# Patient Record
Sex: Female | Born: 1960 | Race: White | Hispanic: No | Smoking: Never smoker
Health system: Southern US, Community
[De-identification: ages and names within clinical notes are randomized; demographics above are authoritative.]

## PROBLEM LIST (undated history)

## (undated) DIAGNOSIS — M62838 Other muscle spasm: Secondary | ICD-10-CM

## (undated) DIAGNOSIS — C801 Malignant (primary) neoplasm, unspecified: Secondary | ICD-10-CM

## (undated) DIAGNOSIS — G4700 Insomnia, unspecified: Secondary | ICD-10-CM

## (undated) DIAGNOSIS — K219 Gastro-esophageal reflux disease without esophagitis: Secondary | ICD-10-CM

## (undated) DIAGNOSIS — I10 Essential (primary) hypertension: Secondary | ICD-10-CM

## (undated) DIAGNOSIS — R519 Headache, unspecified: Secondary | ICD-10-CM

## (undated) DIAGNOSIS — G4733 Obstructive sleep apnea (adult) (pediatric): Secondary | ICD-10-CM

## (undated) HISTORY — DX: Obstructive sleep apnea (adult) (pediatric): G47.33

## (undated) HISTORY — DX: Headache, unspecified: R51.9

## (undated) HISTORY — PX: ABDOMINAL HYSTERECTOMY: SHX81

## (undated) HISTORY — DX: Gastro-esophageal reflux disease without esophagitis: K21.9

## (undated) HISTORY — DX: Other muscle spasm: M62.838

## (undated) HISTORY — DX: Essential (primary) hypertension: I10

## (undated) HISTORY — DX: Insomnia, unspecified: G47.00

## (undated) HISTORY — PX: BREAST SURGERY: SHX581

## (undated) HISTORY — DX: Malignant (primary) neoplasm, unspecified: C80.1

---

## 1997-11-02 ENCOUNTER — Other Ambulatory Visit: Admission: RE | Admit: 1997-11-02 | Discharge: 1997-11-02 | Payer: Self-pay | Admitting: Obstetrics and Gynecology

## 1999-04-19 ENCOUNTER — Encounter: Payer: Self-pay | Admitting: Surgery

## 1999-04-19 ENCOUNTER — Encounter (INDEPENDENT_AMBULATORY_CARE_PROVIDER_SITE_OTHER): Payer: Self-pay | Admitting: Specialist

## 1999-04-19 ENCOUNTER — Ambulatory Visit (HOSPITAL_COMMUNITY): Admission: RE | Admit: 1999-04-19 | Discharge: 1999-04-19 | Payer: Self-pay | Admitting: Surgery

## 1999-05-10 ENCOUNTER — Encounter: Payer: Self-pay | Admitting: Surgery

## 1999-05-15 ENCOUNTER — Inpatient Hospital Stay (HOSPITAL_COMMUNITY): Admission: RE | Admit: 1999-05-15 | Discharge: 1999-05-16 | Payer: Self-pay | Admitting: Surgery

## 1999-06-04 ENCOUNTER — Ambulatory Visit (HOSPITAL_BASED_OUTPATIENT_CLINIC_OR_DEPARTMENT_OTHER): Admission: RE | Admit: 1999-06-04 | Discharge: 1999-06-05 | Payer: Self-pay | Admitting: Surgery

## 1999-06-04 ENCOUNTER — Encounter (INDEPENDENT_AMBULATORY_CARE_PROVIDER_SITE_OTHER): Payer: Self-pay | Admitting: *Deleted

## 1999-06-20 ENCOUNTER — Encounter: Payer: Self-pay | Admitting: Surgery

## 1999-06-20 ENCOUNTER — Ambulatory Visit (HOSPITAL_COMMUNITY): Admission: RE | Admit: 1999-06-20 | Discharge: 1999-06-20 | Payer: Self-pay | Admitting: Surgery

## 2000-04-08 ENCOUNTER — Other Ambulatory Visit: Admission: RE | Admit: 2000-04-08 | Discharge: 2000-04-08 | Payer: Self-pay | Admitting: Obstetrics and Gynecology

## 2000-05-20 ENCOUNTER — Other Ambulatory Visit: Admission: RE | Admit: 2000-05-20 | Discharge: 2000-05-20 | Payer: Self-pay | Admitting: Obstetrics and Gynecology

## 2000-05-20 ENCOUNTER — Encounter (INDEPENDENT_AMBULATORY_CARE_PROVIDER_SITE_OTHER): Payer: Self-pay | Admitting: Specialist

## 2000-09-23 ENCOUNTER — Encounter (INDEPENDENT_AMBULATORY_CARE_PROVIDER_SITE_OTHER): Payer: Self-pay | Admitting: Specialist

## 2000-09-24 ENCOUNTER — Inpatient Hospital Stay (HOSPITAL_COMMUNITY): Admission: RE | Admit: 2000-09-24 | Discharge: 2000-09-25 | Payer: Self-pay | Admitting: Obstetrics and Gynecology

## 2000-10-31 ENCOUNTER — Ambulatory Visit (HOSPITAL_BASED_OUTPATIENT_CLINIC_OR_DEPARTMENT_OTHER): Admission: RE | Admit: 2000-10-31 | Discharge: 2000-10-31 | Payer: Self-pay | Admitting: Plastic Surgery

## 2000-11-10 ENCOUNTER — Ambulatory Visit (HOSPITAL_BASED_OUTPATIENT_CLINIC_OR_DEPARTMENT_OTHER): Admission: RE | Admit: 2000-11-10 | Discharge: 2000-11-10 | Payer: Self-pay | Admitting: Surgery

## 2009-07-27 ENCOUNTER — Ambulatory Visit (HOSPITAL_COMMUNITY): Admission: RE | Admit: 2009-07-27 | Discharge: 2009-07-27 | Payer: Self-pay | Admitting: Gastroenterology

## 2010-05-23 ENCOUNTER — Other Ambulatory Visit: Payer: Self-pay | Admitting: Internal Medicine

## 2010-05-23 DIAGNOSIS — Z9010 Acquired absence of unspecified breast and nipple: Secondary | ICD-10-CM

## 2010-05-23 DIAGNOSIS — Z1231 Encounter for screening mammogram for malignant neoplasm of breast: Secondary | ICD-10-CM

## 2010-05-31 ENCOUNTER — Other Ambulatory Visit (HOSPITAL_COMMUNITY): Payer: Self-pay

## 2010-06-06 ENCOUNTER — Other Ambulatory Visit (HOSPITAL_COMMUNITY): Payer: Self-pay

## 2010-06-07 ENCOUNTER — Ambulatory Visit (HOSPITAL_COMMUNITY): Payer: 59 | Attending: Internal Medicine

## 2010-06-07 ENCOUNTER — Encounter: Payer: Self-pay | Admitting: Cardiology

## 2010-06-07 DIAGNOSIS — Z853 Personal history of malignant neoplasm of breast: Secondary | ICD-10-CM | POA: Insufficient documentation

## 2010-06-07 DIAGNOSIS — I059 Rheumatic mitral valve disease, unspecified: Secondary | ICD-10-CM | POA: Insufficient documentation

## 2010-06-14 ENCOUNTER — Other Ambulatory Visit: Payer: Self-pay | Admitting: Internal Medicine

## 2010-06-14 ENCOUNTER — Ambulatory Visit
Admission: RE | Admit: 2010-06-14 | Discharge: 2010-06-14 | Disposition: A | Discharge status: Home / Self Care | Payer: 59 | Source: Ambulatory Visit | Attending: Internal Medicine | Admitting: Internal Medicine

## 2010-06-14 DIAGNOSIS — Z9010 Acquired absence of unspecified breast and nipple: Secondary | ICD-10-CM

## 2010-06-14 DIAGNOSIS — Z1231 Encounter for screening mammogram for malignant neoplasm of breast: Secondary | ICD-10-CM

## 2010-08-17 NOTE — Op Note (Signed)
[place]  Patient:    Kristin Hobbs, Kristin Hobbs                         MRN: [id number] Proc. Date: 06/20/99 Adm. Date:  [id number] Disc. Date: [id number] Attending:  Bonnetta Barry                           Operative Report  PREOPERATIVE DIAGNOSIS:  Breast carcinoma.  POSTOPERATIVE DIAGNOSIS:  Breast carcinoma.  OPERATION PERFORMED:  Placement of right subclavian vein infusion port.  SURGEON:  Velora Heckler, M.D.  ANESTHESIA:  Local with intravenous sedation.  PREPARATION:  Betadine.  COMPLICATIONS:  None.  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  The patient is now to begin a course of adjuvant chemotherapy t [facility] in [place].  She desires placement of chronic venous access device.  DESCRIPTION OF PROCEDURE:  The procedure was done in OR #16 at the [facility].  The patient was brought to the operating room and placed in  supine position on the operating room table.  Following administration of intravenous sedation, the patient was positioned and then prepped and draped in the usual strict aseptic fashion.  After ascertaining that an adequate level of sedation had been obtained, the skin beneath the right clavicle was anesthetized with local anesthetic.  The patient was placed in Trendelenburg.  Using an 18 gauge Seldinger needle, the right subclavian vein was entered in a single pass.  A soft J-shaped guide wire was inserted and the needle was removed.  Guide wire placement was confirmed with C-arm fluoroscopy.  Next, a site on the right chest wall was  anesthetized with local anesthetic.  A 3 cm incision was made with a #15 blade.  Dissection was carried down to the subcutaneous tissues and a subcutaneous pocket was created to accommodate the infusion  port.  Hemostasis was obtained with the  electrocautery.  The port was brought on the field.  The catheter was tunneled subcutaneously to the site of guide wire insertion.  The port was secured to the chest wall at two sites with interrupted 2-0 Prolene sutures.  The catheter was  measured to the appropriate length and divided.  Using a dilator and peel-away sleeve, the catheter was inserted into the right subclavian vein without difficulty.  C-arm fluoroscopy confirms catheter to be position.  However, there is a slight curl to the tip.  By manipulating the catheter in the subcutaneous space, I am able to straighten the catheter so that there is no curl to the tip and the catheter was oriented in the proper position with the tip at the junction of the superior vena cava and the right atrium.  C-arm fluoroscopy confirmed placement. The port aspirates blood easily and flushes easily with heparinized saline. The wounds were closed with interrupted 4-0 Vicryl subcutaneous sutures.  The skin as closed with interrupted 4-0 Vicryl subcuticular sutures.  The wound was washed nd dried and benzoin and Steri-Strips were placed.  The port was then accessed with a right angle Huber needle on a butterfly type tubing attachment.  It was again flushed with heparinized saline and then flushed with 3 cc of standard strength  1000 unit per cc heparin.  Sterile occlusive gauze dressing was placed.  The patient was awakened  from anesthesia and brought to the recovery room in stable  condition.  The patient tolerated the procedure well.  A portable chest x-ray will be performed in recovery room to confirm catheter placement. DD:  06/20/99 TD:  06/20/99 Job: [id number]

## 2010-08-17 NOTE — Op Note (Signed)
[facility]  Patient:    Kristin Hobbs, Kristin Hobbs                       MRN: [id number] Proc. Date: 09/23/00 Adm. Date:  [id number] Disc. Date: [id number] Attending:  Cordelia Pen Ii                           Operative Report  PREOPERATIVE DIAGNOSIS: 1. Left breast cancer. 2. Acquired absence of left breast. 3. Radiation fibrosis, left anterior chest skin.  POSTOPERATIVE DIAGNOSIS: 1. Left breast cancer. 2. Acquired absence of left breast. 3. Radiation fibrosis, left anterior chest skin.  OPERATION PERFORMED: 1. Debridement of radiated left anterior chest skin. 2. Latissimus dorsi myocutaneous flap. 3. Placement of 500 cc McGhan sublatissimus tissue expander inflated to    100 cc at the time of surgery.  SURGEON:  Alfredia Ferguson, M.D.  ASSISTANT:  Pleas Patricia, Montez Hageman., M.D.  ANESTHESIA:  General endotracheal.  INDICATIONS FOR PROCEDURE:  The patient is a 50 year old woman who is status post left mastectomy for breast cancer.  She has been treated with chemotherapy followed by radiation therapy.  An attempt at placement of a tissue expander on the left side was made but it was complicated by an infection which required removal of the tissue expander.  The patient is undergoing a hysterectomy and she wishes to attempt another tissue expander. She was told that it would be impossible to expand her without the use of a latissimus muscle.  She did not want to undergo a TRAM flap, since it would require bilateral TRAMS.  The plan is to try a latissimus flap with placement of the implant beneath the latissimus muscle.  The potential risks of surgery including infection, bleeding, fibrosis, inability to expand the patient, unsightly scarring, asymmetry, exposure of the tissue expander and overall dissatisfaction with the results have been discussed.  In spite of the these and other risks discussed, the patient wishes to proceed with  surgery.  DESCRIPTION OF PROCEDURE:  Prior to surgery, the patient was marked, outlining the dimensions of a skin paddle on her back measuring approximately 6.5 cm wide x 15 cm long.  Anteriorly, the plan is to debride as much of the fibrotic mastectomy skin as possible.  It was marked on the day prior to surgery. Following the completion of the hysterectomy, the patient was turned into a right lateral decubitus position and all pressure points were padded including placement of an axillary roll.  The patient was reprepped and draped. Attention was first directed to the old mastectomy site.  An excision of the mastectomy scar with a couple of centimeters on either side of the scar was carried out.  I would have like to remove more; however, after removing even the 3 to 3.5 cm of skin, the mastectomy defect widened such that I felt that a further debridement would be unwise.  The breast flaps were undermined to their original dimensions of the mastectomy.  The anterior border of the latissimus muscle was now visualized.  An elliptical incision was made through the previously placed marks on her back in order to isolate a skin paddle over the latissimus muscle.  The elliptical incisions were deepened until visualized in the latissimus muscle.  Through this approach the back flap was elevated off of the latissimus dorsi fascia to the midline and then up to the scapula and then inferiorly near its origin.  Good hemostasis was accomplished throughout this dissection using electrocautery.  The anterior edge of the latissimus muscle was now elevated and through this approach, the deep surface of the latissimus muscle was dissected off the chest wall. Inferiorly, I divided the latissimus muscle near its origin and the muscle was elevated from an inferior to superior direction.  The muscle was divided using electrocautery.  Hemostasis was meticulously accomplished using electrocautery.  The  myocutaneous flap was elevated up to the point where the latissimus began to narrow towards its insertion.  A tunnel was created in order to allow passing the myocutaneous flap anteriorly.  The muscle and its overlying skin paddle was now tunneled into the desired position.  It rotated with no tension whatsoever.  The color of the flap was excellent at the conclusion of the rotation.  The back wound was now copiously irrigated with saline irrigation.  Hemostasis was assured using electrocautery.  Two Blake drains were placed in the back wound and brought out through separate stab incisions.  The back incision was closed by approximating the dermis using a combination of interrupted 0 and 2-0 Vicryl sutures in the dermis.  The skin edges were united using a running 3-0 Monocryl suture.  The bean bag was now deflated and the patient was allowed to rotate slightly into a more supine position in order to allow working on her anterior chest.  The mastectomy dissection was irrigated with saline irrigation and a 10 mm Blake drain was placed in the mastectomy dissection and brought out through a separate stab incision.  The superior portion of the latissimus muscle was tacked to the upper limits of the mastectomy dissection.  3-0 Vicryl was used to tack the muscle in place.  Medially, a similar tacking was carried out.  A 500 cc McGhan tissue expander was prepared by evacuating the air and then inflating it with 100 cc of saline.  The tissue expander was placed into the desired position beneath the latissimus muscle.  Inferiorly, the latissimus was again tacked down using interrupted 3-0 Vicryl suture to the inferior limits of the mastectomy dissection.  The superior cut edge of the skin paddle was now united to the superior edge of the breast flap using multiple interrupted 3-0 Monocryl sutures to the dermis, followed by a running 3-0 Monocryl subcuticular.  Similar closure was carried out on the  inferior skin paddle to the inferior breast flap.  The inferior breast flap was very tight due to radiation fibrosis.  I was unable to remove any additional skin due to the  tightness of the skin and its inability in my opinion to be able to be closed had I removed more skin.  Following closure of the skin several interrupted 4-0 nylon sutures were placed in the skin edges to assure closure of the edges.  Steri-Strips were applied along the skin edges.  A light dressing was applied anteriorly.  The patient tolerated the procedure well with minimal blood loss estimated to be approximately 100 to 150 cc.  She was awakened, extubated and transported to the recovery room in satisfactory condition. DD:  09/23/00 TD:  09/23/00 Job: [id number]

## 2010-08-17 NOTE — Op Note (Signed)
. [facility]  Patient:    Kristin Hobbs, Kristin Hobbs                         MRN: [id number] Proc. Date: 05/15/99 Adm. Date:  [id number] Attending:  Bonnetta Barry CCAlfredia Ferguson, M.D.             Guy Sandifer Arleta Creek, M.D.             Pearson Forster, M.D.             [facility]                           Operative Report  PREOPERATIVE DIAGNOSIS: 1. 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 2. Incarcerated umbilical hernia.  POSTOPERATIVE DIAGNOSIS: 1. 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 2. Incarcerated umbilical hernia.  OPERATION PERFORMED: 1. Left total mastectomy. 2. Repair of incarcerated umbilical hernia with Prolene mesh.  SURGEON:  Velora Heckler, M.D.  ASSISTANT:  Rose Phi. Maple Hudson, M.D.  ANESTHESIA:  General.  ESTIMATED BLOOD LOSS:  Minimal.  PREPARATION:  Betadine.  COMPLICATIONS:  None.  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  Studies showed an area of architectural distortion and extensive microcalcification in the lateral portion of the left breast.  Studies were reviewed with our radiologist and a core needle biopsy was performed which documented DCIS with comedo type necrosis and high grade changes. Due to the extensive nature of the disease, the best course of treatment was mastectomy.  The patient was seen in consultation with Dr. Delia Chimes for immediate reconstruction with tissue expanders to be followed by implants.  The  patient also has an incarcerated hernia which she desires to be repaired concurrently.  She comes to surgery today for left mastectomy, reconstruction, nd umbilical hernia repair.  DESCRIPTION OF PROCEDURE:  The procedure was done in OR #15 at the [facility].  The patient  was brought to the operating room and placed in  supine position on the operating room table.  Following administration of general anesthesia, the patient was positioned and then prepped and draped in the usual  strict aseptic fashion.  After ascertaining that an adequate level of anesthesia had been obtained, an elliptical incision was made on the left breast so as to encompass the entire nipple areolar complex.  Dissection was carried into the subcutaneous tissues.  Skin flaps were developed circumferentially using breast  hooks and the electrocautery for hemostasis.  The usual margins of dissection were followed including the sternum medially, the clavicle superiorly, the latissimus dorsi laterally and the rectus sheath inferiorly.  The breast was then reflected laterally off of the chest wall taking care to preserve the pectoralis major muscle.  Good hemostasis was noted.  Two low lymph nodes from the low axilla were included with the specimen and marked with a suture for pathologic review. Good hemostasis was obtained throughout the wound and a moist gauze sponge was placed in the defect.  At this point Dr. Delia Chimes proceeded with immediate reconstruction using tissue expander.  That will be dictated under a separate operative report.  Next I addressed the incarcerated umbilical hernia.  A transverse  umbilical incision was made just below the umbilicus.  Dissection was carried into the subcutaneous tissues.  The hernia defect was defined.  It contained incarcerated omentum.  This was freed from the undersurface of the umbilicus and reduced back within the peritoneal cavity.  The defect measures approximately 1.5 cm in size. It was closed with interrupted 0 Ethibond sutures.  Next, a piece of Prolene mesh was cut to the appropriate dimensions and placed on the muscular wall of the abdomen.  It was secured circumferentially with 0 Ethibond sutures.  The  wound as irrigated with saline, good hemostasis was noted.  The umbilicus was fixed back  down to the abdominal wall with an interrupted 3-0 Vicryl suture.  Subcutaneous  tissues were reapproximated with interrupted 3-0 Vicryl sutures.  Skin was closed with interrupted 4-0 Vicryl subcuticular sutures.  The wound was washed and dried and benzoin and Steri-Strips were applied at the end of the case.  Dr. Benna Dunks will dictate his breast augmentation and tissue expander reconstruction under a separate operative report.  The patient tolerated this portion of the procedure very well. DD:  05/15/99 TD:  05/15/99 Job: [id number]

## 2010-08-17 NOTE — Op Note (Signed)
[facility]  Patient:    Kristin Hobbs, Kristin Hobbs                       MRN: [id number] Proc. Date: 09/23/00 Adm. Date:  [id number] Attending:  Cordelia Pen Ii                           Operative Report  PREOPERATIVE DIAGNOSIS:       Status post breast cancer, desires hysterectomy with bilateral salpingo-oophorectomy.  POSTOPERATIVE DIAGNOSIS:      Status post breast cancer, desires hysterectomy with bilateral salpingo-oophorectomy.  PROCEDURE:                    Laparoscopically assisted vaginal hysterectomy with bilateral salpingo-oophorectomy.  SURGEON:                      Guy Sandifer. Arleta Creek, M.D.  ASSISTANTFreddy Finner, M.D.  ANESTHESIA:                   General with endotracheal intubation.  ESTIMATED BLOOD LOSS:         299 cc.  INDICATIONS AND CONSENT:      The patient is a 50 year old married white female, G2, P2 status post left breast cancer.  Details are dictated in the history and physical.  Laparoscopically assisted vaginal hysterectomy with bilateral salpingo-oophorectomy was discussed with the patient.  THe possible risks and complications were discussed, including but not limited to infection; bowel, bladder or ureteral damage; bleeding requiring transfusion of blood products with possible transfusion reaction, HIV and hepatitis acquisition; DVT; PE; pneumonia; fistula formation.  The small chance of recurrent ovarian-type carcinoma following BSO was also discussed.  All questions were answered and consent was signed and on the chart.  FINDINGS:                     There was a dense adhesion of the omentum and a loop of the small bowel to the upper part of the midline infraumbilical scar to the anterior abdominal wall.  The uterus was normal in size.  The anterior and posterior cul-de-sac were normal.  The tubes and ovaries were normal.  DESCRIPTION OF PROCEDURE:     The patient was taken to the  operating room and placed in the dorsal supine position, where general anesthesia was induced via endotracheal intubation.  She was then placed in the dorsal lithotomy position, where she  was prepped abdominal and vaginal.  The bladder was straight catheterized.  A Hulka tenaculum was placed in the uterus as a manipulator and she was draped in a sterile fashion.  An open Hasson-type disposable trocar sleeve was used.  An infraumbilical incision was made using two Allis clamps to elevate the abdominal wall.  Dissection was carried out in layers to the peritoneum, which was bluntly entered.  Anchoring sutures of 0 Vicryl were placed at each angle of the fascia and the Hasson scope was placed and anchored down.  Pneumoperitoneum was induced and the above findings were noted.  No damage to the bowel or any other structure was noted after careful inspection.  The vesicoperitoneum was noted to extend somewhat high on the lower anterior abdominal wall.  A suprapubic incision was made with care being  taken to go above this level and a 5 mm nondisposable trocar sleeve was placed under direct visualization without difficulty.  After careful transillumination, a left lower quadrant incision was made and a second 5 mm trocar sleeve was then placed under direct visualization without difficulty. Then, using a 5 mm laparoscope through the left lower quadrant trocar sleeve, the Ligasure laparoscopic instrument was used to take down the right infundibulopelvic ligament, followed by the meso, followed by the round ligament.  This procedure was repeated on the left side.  The course of the ureters were then identified carefully prior to this and seen to be well clear of the area of surgery.  The 5 mm laparoscope was then removed and reinspection with the operative laparoscope revealed good hemostasis.  The vesicouterine peritoneum was then incised in the midline.  It was densely adherent to the lower uterine  segment in the midline status post her cesarean sections.  The vesicouterine peritoneum was taken down cephalolaterally.  The instruments were removed and attention was then turned to the vagina.  The posterior cul-de-sac was entered sharply with scissors.  The uterosacral ligaments were then taken down bilaterally, again using the Ligasure instrument.  The cervix was then circumscribed with a scalpel and the vaginal mucosa was advanced bluntly and sharply.  The bladder pillars followed by the cardinal ligaments were then taken down bilaterally.  Again, the bladder was densely adherent to the midline along the lower uterine segment and the upper cervix.  The uterine vessels were then taken bilaterally.  The uterine fundus with tubes and ovaries was then delivered posteriorly and an examining finger was placed on the anterior portion of the uterus and the vesicouterine peritoneum was then taken down sharply, cutting over the examining finger to protect the bladder.  The pedicles were then taken down bilaterally and the specimen was delivered.  The uterosacral ligaments were then plicated to the vagina bilaterally and plicated in the midline with a separate suture.  The cuff was then closed with figure-of-eights.  A Foley catheter was placed in the bladder and clear urine was noted.  Attention was returned to the adequate and careful inspection revealed bleeding from the right vaginal pedicle.  This was controlled with bipolar cautery without difficulty.  Copious irrigation was carried out and excellent hemostasis was noted all around.  The inferior trocar sleeves were removed.  The pneumoperitoneum was reduced and no bleeding was noted from any site.  The instruments were then removed along with the Hasson trocar sleeve.  The fascia was then carefully reapproximated with interrupted 0 Vicryl sutures, with great care being taken not to pick up any underlying structures.  Interrupted 4-0 Vicryl  sutures were then placed subcutaneously and the skin was closed in all the incisions with Dermabond. The incisions were then injected with 0.5% plain Marcaine.  All counts were  correct and the patient was stable.  At this point, Dr. Benna Dunks began his procedure. DD:  09/23/00 TD:  09/23/00 Job: [id number]

## 2010-08-17 NOTE — Op Note (Signed)
[place]  Patient:    Kristin Hobbs, Kristin Hobbs                       MRN: [id number] Proc. Date: 11/10/00 Adm. Date:  [id number] Disc. Date: [id number] Attending:  Loura Halt Ii                           Operative Report  PREOPERATIVE DIAGNOSIS:  History of left breast carcinoma.  POSTOPERATIVE DIAGNOSIS:  History of left breast carcinoma.  OPERATION PERFORMED:  Removal of right subclavian vein infusion port.  SURGEON:  Velora Heckler, M.D.  ANESTHESIA:  Local with intravenous sedation.  PREPARATION:  Betadine.  COMPLICATIONS:  None.  ESTIMATED BLOOD LOSS:  Minimal.  INDICATIONS FOR PROCEDURE:  The patient is a 50 year old white female being treated for left breast carcinoma.  She has completed adjuvant chemotherapy. She desires removal of her chronic venous access device.  DESCRIPTION OF PROCEDURE:  The procedure was done in OR #3 at the [facility].  The patient was brought to the operating room and placed in supine position on the operating room table.  Following administration of general sedation, the patient was prepped and draped in the usual strict aseptic fashion.  After ascertaining that an adequate level of sedation had been obtained, the skin on the upper right chest wall was anesthetized with local anesthetic. An elliptical incision was made with a #15 blade so as to excise the previous surgical scar.  Dissection was carried down through the subcutaneous tissues and hemostasis was obtained with the electrocautery.  The fibrous sheath was entered and the port was mobilized.  The port was delivered up and through the wound.  Suture material securing the port to the chest wall was extracted.  The port was removed completely and a 3-0 Vicryl pursestring suture was placed along the catheter tract to seal it.  Next, the fibrous sheath was obliterated with the electrocautery.  Subcutaneous tissues  were reapproximated with interrupted 3-0 Vicryl sutures.  Skin edges were reapproximated with interrupted 4-0 Vicryl subcuticular sutures.  The wound was washed and dried and benzoin and Steri-Strips were applied.  The Port-A-Cath was cleansed and then given to the patient.  The patient tolerated the procedure well. DD:  11/10/00 TD:  11/10/00 Job: [id number]

## 2010-08-17 NOTE — H&P (Signed)
[facility]  Patient:    Kristin Hobbs, Kristin Hobbs                         MRN: [id number] Adm. Date:  09/23/00 Attending:  Guy Sandifer. Arleta Creek, M.D.                         History and Physical  CHIEF COMPLAINT:              History of breast cancer on tamoxifen for LAVH/BSO.  HISTORY OF PRESENT ILLNESS:   This patient is a 50 year old married white female, G2, P2 who was diagnosed with a left breast cancer in February of 2001. She subsequently underwent a left total mastectomy that month. Subsequent to that, she has undergone chemotherapy and radiation. Finally, she had removal of the implant in the left breast in April of this year after apparent rupture and infection around the implant. This has left her with some subsequent lymphedema of the left arm. At the time of her annual exam, in January of this year, the patient was amenorrheic and on tamoxifen. An ultrasound was then undertaken to evaluate the endometrial stripe. This revealed a normal-sized uterus with an 8 mm endometrial stripe and a 16 mm simple cyst of the right ovary. Endometrial biopsy on May 19, 2000, revealed a weakly proliferative endometrium without hyperplasia or malignancy. The patient discussed this situation with her oncologist Dr. Rolly Salter at [facility]. Due to the patients increased risk of endometrial carcinoma, as well as her aggressive breast cancer, she requested a hysterectomy with BSO. After a careful discussion of the surgery and its risks, the patient is being admitted for laparoscopically-assisted vaginal hysterectomy with bilateral salpingo-oophorectomy. Alfredia Ferguson, M.D. will then follow for TRAM flap reconstruction of the left breast.  PAST MEDICAL HISTORY:         1. Left breast cancer as above.                               2. Ankylosing spondylitis.  PAST SURGICAL HISTORY:        1. Left total mastectomy, February 2001.                               2.  Repair of incarcerated umbilical hernia with                                  Prolene mesh, February of 2001.  OBSTETRIC HISTORY:            Cesarean section x 2.  FAMILY HISTORY:               Negative for heart disease, cancer, diabetes, and hypertension. Her brother does have ankylosing spondylitis and a sister has hyperthyroidism.  MEDICATIONS:                  1. Tamoxifen.                               2. Coumadin 1 mg daily for her Port-A-Cath.  3. Prevacid 30 mg.                               4. Celebrex 200 mg b.i.d.                               5. Paxil 20 mg daily.                               6. Nu-Iron daily.                               7. Darvocet p.r.n.  ALLERGIES:                    No known drug allergies.  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 Denies alcohol, tobacco, or drug abuse.  PHYSICAL EXAMINATION:  VITAL SIGNS:                  Height 5 feet 8 inches, weight 171 1/2 pounds, blood pressure 112/78.  HEENT/NECK:                   Without thyromegaly.  LUNGS:                        Clear to auscultation.  HEART:                        Regular rate and rhythm.  BACK:                         Without CVA tenderness.  BREASTS:                      Right breast without mass, traction, discharge on examination of January of this year. Left breast:  The chest is bandaged, status post removal of the implant.  ABDOMEN:                      Soft, nontender, without masses.  PELVIC:                       Vulva, vagina, cervix without lesion. Uterus normal size, nontender. Adnexa nontender without masses. Rectovaginal exam without masses.  EXTREMITIES:                  Reveals 1+ lymphedema of the left arm. Lower extremities without edema.  NEUROLOGICAL:                 Grossly within normal limits.  ASSESSMENT:                   Status post breast cancer desires  hysterectomy with bilateral salpingo-oophorectomy.  PLAN:                         Laparoscopically-assisted vaginal hysterectomy with bilateral salpingo-oophorectomy and TRAM flap reconstruction to follow with W. Delia Chimes, M.D.DD:  09/10/00 TD:  09/10/00 Job: [id number]

## 2010-08-17 NOTE — Op Note (Signed)
[place]. [facility]  Patient:    Kristin Hobbs, Kristin Hobbs                       MRN: [id number] Proc. Date: 10/31/00 Adm. Date:  [id number] Attending:  Loura Halt Ii                           Operative Report  PREOPERATIVE DIAGNOSIS:  Infected tissue expander, left breast reconstruction.  POSTOPERATIVE DIAGNOSIS:  Infected tissue expander, left breast reconstruction.  PROCEDURE:  Removal of left chest tissue expander.  SURGEON:  Alfredia Ferguson, M.D.  ANESTHESIA:  General laryngeal mask anesthesia.  INDICATION FOR SURGERY:  This is a 50 year old woman who is status post latissimus flap with placement of a tissue expander beneath the latissimus flap.  She also has a history of radiation therapy to her breast flaps after mastectomy.  The patient has done fine over the last five weeks, although she has had a chronic seroma.  I removed the drain about one week prior to the surgery.  Since removal of the drain, she has had a progressive increase in erythema in the medial aspect of the left chest in the vicinity of the tissue expander.  The skin has thinned.  I am concerned that there is an impending exposure.  Patient denies fevers.  In light of her radiation and the erythema, my feeling is that the tissue expander should be removed.  I aspirated some of the fluid yesterday, and it came back cloudy, yellow, with Gram stain showing numerous polymorphonuculeocytes.  The patient understands that we going to remove the tissue expander.  She understands that she will be flat other than the presence of the latissimus flap.  In spite of that, she wishes to proceed.  DESCRIPTION OF PROCEDURE:  After adequate general laryngeal mask anesthesia was induced, the patients left chest was prepped and draped in a sterile fashion.  Initially a two-inch incision was made along the previous skin paddle incision of the latissimus flap and was deepened through the skin  and subcutaneous tissue until reaching the latissimus flap.  The inferior breast flap was elevated off of the latissimus flap inferiorly and laterally.  The latissimus muscle was visualized and was opened.  There was an immediate rush of cloudy, mucousy fluid.  Approximately 300-400 cc of fluid was present. Cultures were obtained.  The tissue expander was punctured in order to remove the saline.  The tissue expander was then removed.  The pocket was copiously irrigated with saline irrigation.  There was a significant amount of granulation tissue throughout the pocket.  There was also accumulations of mucoid material, which was also evacuated.  A 10 mm Blake drain was placed in the pocket.  It was secured through a separate stab incision.  After achieving hemostasis, the latissimus muscle was tacked back down to the inferior breast flap.  Vicryl 3-0 suture was used for this tacking.  The dermis was closed using multiple interrupted 3-0 Vicryl suture.  A running 3-0 Vicryl subcuticular was placed.  The patient tolerated the procedure well.  The chest was cleansed.  Steri-Strips were applied to the skin incision.  A bulky dressing over the left chest was placed with a circumferential wrap with a six-inch Ace bandage.  The patient was awakened, extubated, and transported to the recovery room in satisfactory condition. DD:  10/31/00 TD:  11/01/00 Job: [id number]

## 2010-08-17 NOTE — Op Note (Signed)
[place]. [facility]  Patient:    Kristin Hobbs, Kristin Hobbs                         MRN: [id number] Proc. Date: 06/04/99 Adm. Date:  [id number] Attending:  Bonnetta Barry CCAlfredia Ferguson, M.D.             Dr. Lillia Mountain in Medical Oncology Division at [facility]                           Operative Report  PREOPERATIVE DIAGNOSIS:  Left breast carcinoma.  POSTOPERATIVE DIAGNOSIS:  Left breast carcinoma.  PROCEDURE:  Left axillary lymph node dissection.  SURGEON:  Velora Heckler, M.D.  ANESTHESIA:  General.  ESTIMATED BLOOD LOSS:  Minimal.  PREPARATION:  Betadine.  COMPLICATIONS:  None.  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 The patient now comes to surgery for a left axillary lymph node dissection for further staging of her left breast carcinoma.  DESCRIPTION OF PROCEDURE:  The procedure was done in OR #3 at the [facility].  The patient was brought to the operating room and placed in a supine position on the operating room table.  Following the administration of general anesthesia, the patient was prepped and draped in the usual strict aseptic fashion. After ascertaining that an adequate level of anesthesia had been obtained, a left axillary incision was made with a #15 blade.  Dissection was carried through the subcutaneous tissues.  Hemostasis was obtained with the electrocautery. Dissection was carried into the axilla.  The lateral edge of the pectoralis major muscle was identified.  A small seroma in the previous operative field was evacuated.  The  axillary contents were then dissected away from the chest wall.  Dissection was  carried up to the level of the axillary vein, and dissection was kept  just inferior to the axillary vein.  Long thoracic nerve and thoracodorsal nerve and vessels ere identified and preserved.  The axillary contents were swept down and out of the  axilla.  There are no obvious grossly positive lymph nodes in the specimen. The entire axillary content is submitted to pathology for review.  Hemostasis was obtained with small Ligaclips.  Good hemostasis was noted.  A  #10 Jackson-Pratt drain was brought in from an inferior stab wound, and then secured to the skin with a 4-0 nylon suture.  The wound was irrigated with warm  saline which was evacuated.  Subcutaneous tissues were reapproximated with interrupted 3-0 Vicryl sutures.  The skin edges were reapproximated with interrupted 4-0 Vicryl subcuticular sutures.  The wound was washed and dried, and Benzoin and Steri-Strips are applied.  Sterile gauze dressings were applied.  The patient was awakened from anesthesia and brought to the recovery room in stable condition.  The patient tolerated the procedure well. DD:  06/04/99 TD:  06/05/99 Job: [id number]

## 2010-08-17 NOTE — Discharge Summary (Signed)
[facility]  Patient:    Kristin Hobbs, Kristin Hobbs                       MRN: [id number] Adm. Date:  [id number] Disc. Date: 09/25/00 Attending:  Cordelia Pen Ii CC:         Alfredia Ferguson, M.D.   Discharge Summary  ADMITTING DIAGNOSES:          1. Status post breast cancer, desires                                  hysterectomy with bilateral                                  salpingo-oophorectomy.                               2. Status post mastectomy, requests                                  reconstructive surgery.  DISCHARGE DIAGNOSES:          1. Status post breast cancer, desires                                  hysterectomy with bilateral                                  salpingo-oophorectomy.                               2. Status post mastectomy, requests                                  reconstructive surgery.  PROCEDURES:                   1. Laparoscopically assisted vaginal                                  hysterectomy with bilateral                                  salpingo-oophorectomy, September 23, 2000.                               2. Left platysmus breast reconstruction, Dr.                                  Benna Dunks.  REASON FOR ADMISSION:         This patient is a 50 year old married white female, G2, P2, status post left breast cancer, status post left mastectomy, who requested removal of her ovaries and uterus as dictated in the history and physical.  She also required reconstruction  of the left breast.  After a thorough discussion with me and with Dr. Benna Dunks, she was admitted for the above procedures.  HOSPITAL COURSE:              The patient was admitted to the hospital and underwent the above procedures with a total blood loss of approximately 350 cc.  On the evening of surgery, she was afebrile with good urine output. On the first postoperative day, she was tolerating a regular diet an ambulating.  She remained afebrile.   Hemoglobin was 9.9 and white count was 4.1.  On the date of discharge, she continued to ambulate well, tolerating a regular diet and had good pain relief with oral pain medications.  CONDITION ON DISCHARGE:       Good.  DIET:                         Regular as tolerated.  ACTIVITY:                     No lifting.  No operation of automobiles.  No vaginal entry.  She is to call for problems including but not limited to temperature 101 or above, heavy vaginal bleeding, persistent nausea or vomiting or increasing pain.  Instructions have also been given per Dr. Benna Dunks.  MEDICATIONS:                  Tylox, #20, 1-2 p.o. q.6h. p.r.n.  Vicodin, #25, 1-2 p.o. q.6h. p.r.n.  Multivitamin daily.  The patient will resume Coumadin 1 mg q.d. tomorrow.  She will also resume Tamoxifen tomorrow.  FOLLOW UP:                    In two weeks in the office and per Dr. Audelia Hives instructions. DD:  09/25/00 TD:  09/25/00 Job: [id number]

## 2010-12-24 DIAGNOSIS — C50919 Malignant neoplasm of unspecified site of unspecified female breast: Secondary | ICD-10-CM | POA: Insufficient documentation

## 2011-05-02 IMAGING — CT CT ABD-PELV W/ CM
2 of 5 series · 16 of 46 positions shown, 18 images · IV contrast (APPLIED)
Comparison: None.

CLINICAL DATA: Abdominal pain, nausea, history of breast carcinoma,
polyps recently removed during colonoscopy

CT ABDOMEN AND PELVIS WITH CONTRAST
TECHNIQUE: Multidetector CT imaging of the abdomen and pelvis was
performed following the standard protocol during bolus
administration of intravenous contrast.
Contrast: 100 ml Fmnipaque-NOO

[Series 2: abd/pelv with 5.0 b31f st · axial · 0.65mm/px · z∈[-438,-63]mm · 13 of 85 slices shown, 15 images]
[im 5/85  soft-tissue]
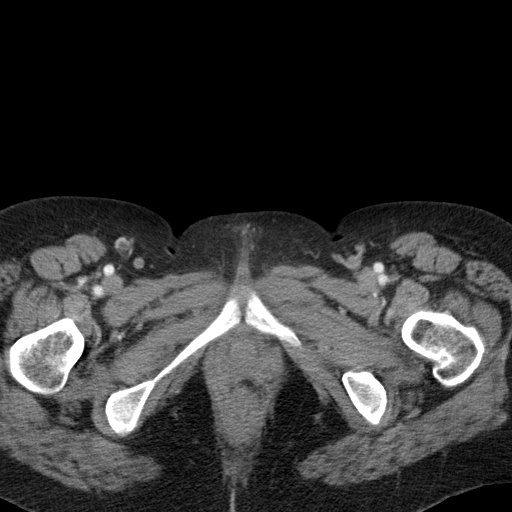
[im 5/85  bone]
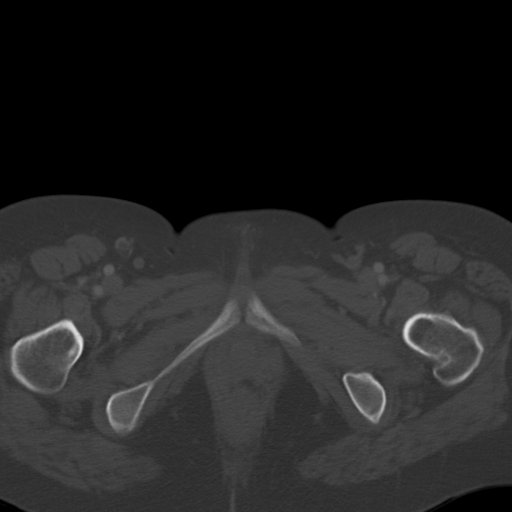
[im 13/85  soft-tissue]
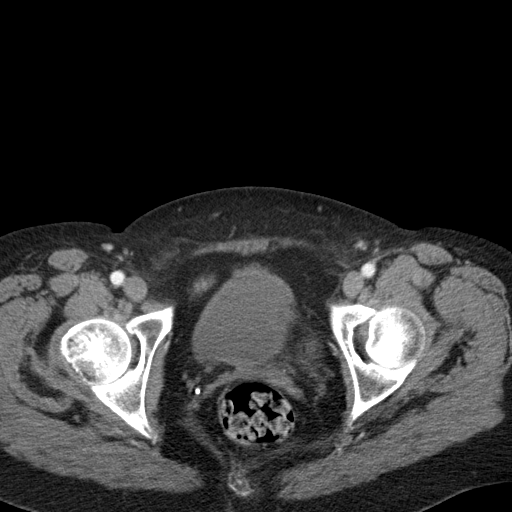
[im 17/85  soft-tissue]
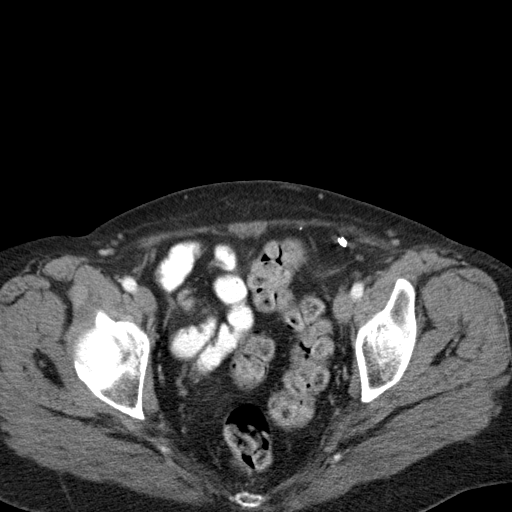
[im 26/85  soft-tissue]
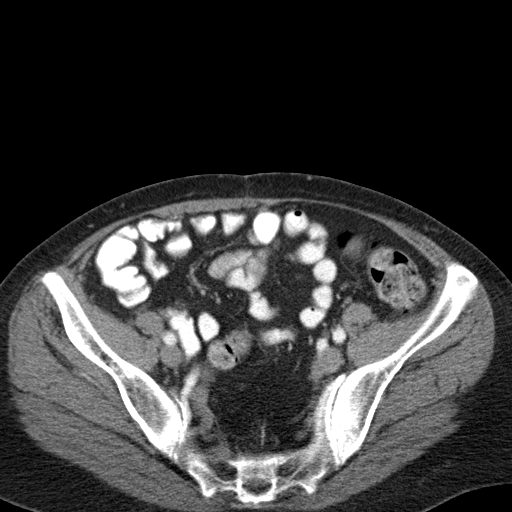
[im 30/85  soft-tissue]
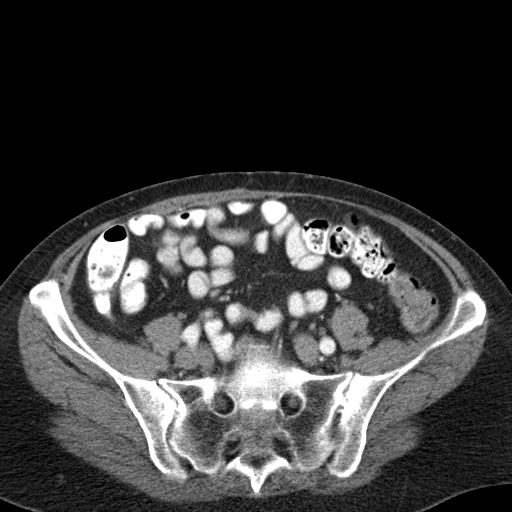
[im 38/85  soft-tissue]
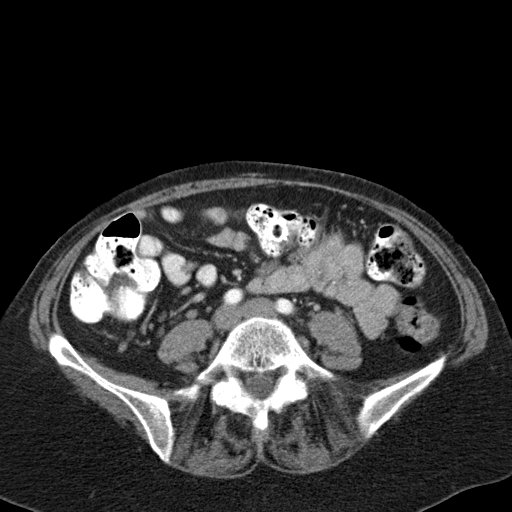
[im 43/85  soft-tissue]
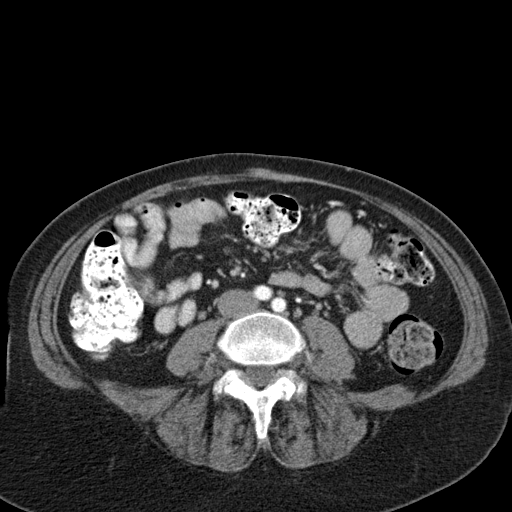
[im 47/85  soft-tissue]
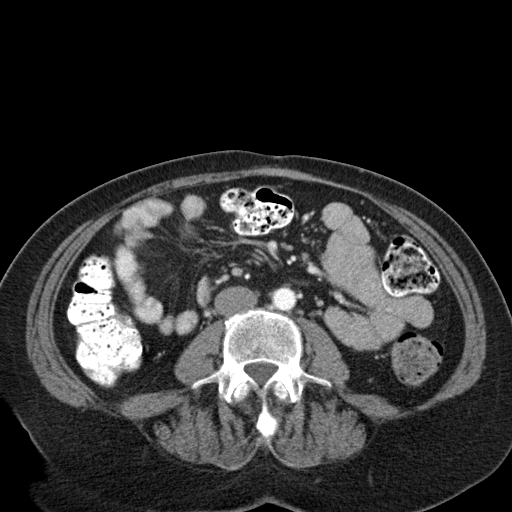
[im 55/85  soft-tissue]
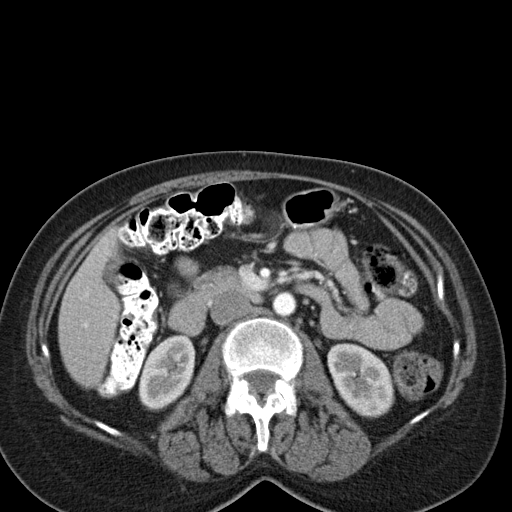
[im 55/85  bone]
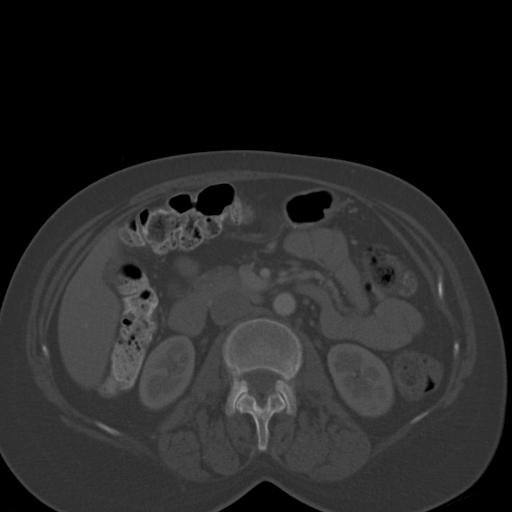
[im 59/85  soft-tissue]
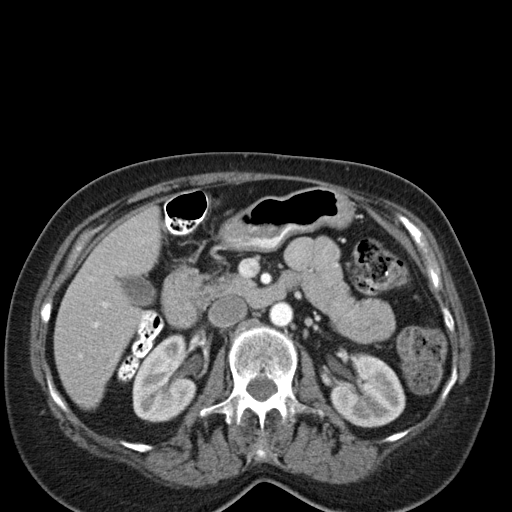
[im 68/85  soft-tissue]
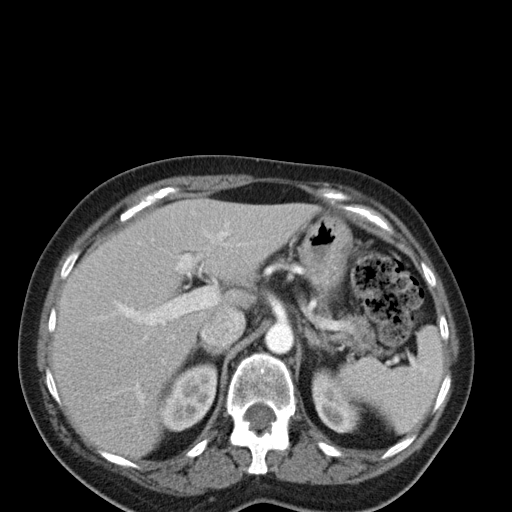
[im 72/85  soft-tissue]
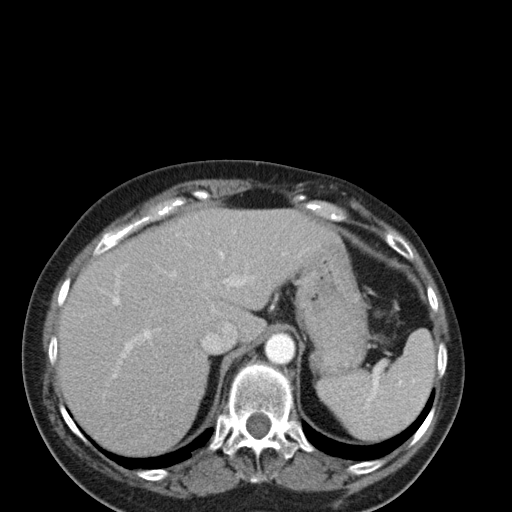
[im 80/85  soft-tissue]
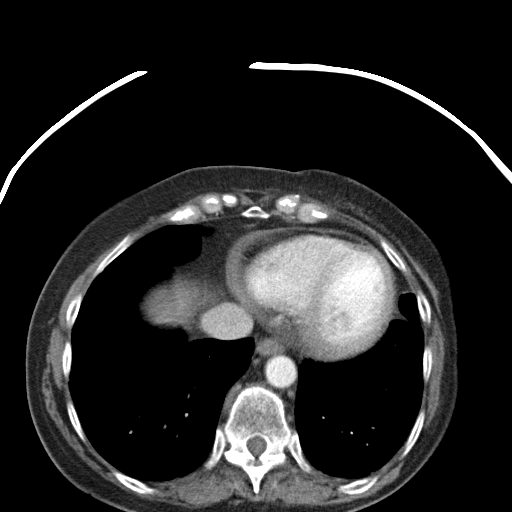

[Series 602: cor a/p · coronal · 0.83mm/px · 3 of 42 slices shown]
[im 14/42  soft-tissue]
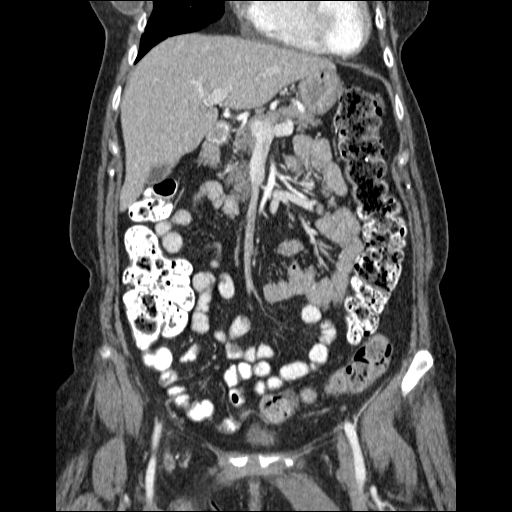
[im 19/42  soft-tissue]
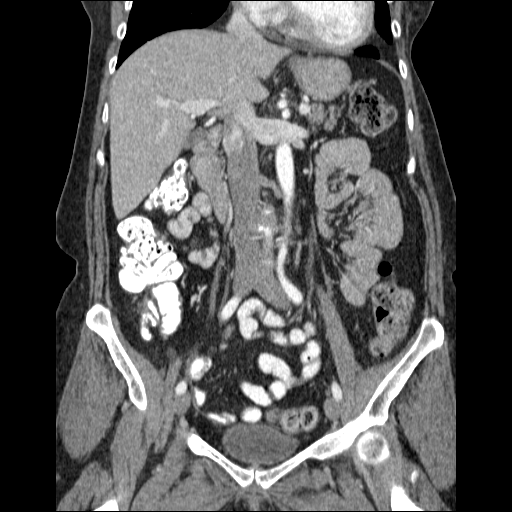
[im 23/42  soft-tissue]
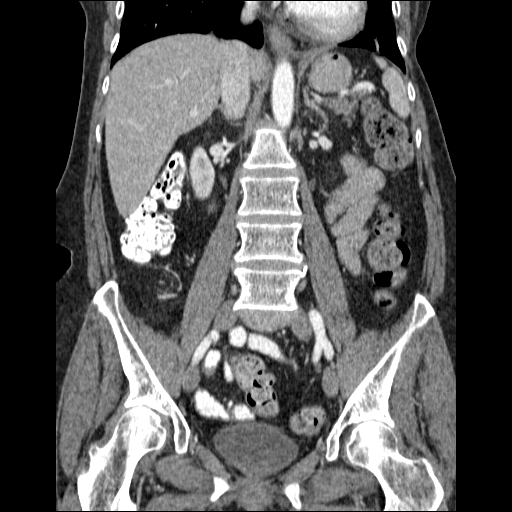

[16 of 46 positions shown; findings below may reference images not displayed]

FINDINGS: The lung bases are clear.  There is a small low
attenuation oval structure adjacent to the right heart border
anteriorly which may represent a small pericardial cyst of 34 x 16
mm.  No definite pericardial effusion is seen.  The liver enhances
with no focal abnormality and no ductal dilatation is seen.  No
calcified gallstones are noted.  The pancreas is normal in size and
the pancreatic duct is not dilated.  The adrenal glands and spleen
are unremarkable.  The stomach is not well distended.  The kidneys
enhance with no calculus or mass and no hydronephrosis is seen.
The abdominal aorta is normal in caliber.  No adenopathy is seen.

Slightly prominent lymph nodes are present in the right lower
quadrant which are nonspecific but can be seen with mesenteric
adenitis.  The urinary bladder is unremarkable.  The patient has
previously undergone hysterectomy.  No adnexal lesion is seen.  No
fluid is seen within the pelvis.  The appendix is not well seen but
appears somewhat shortened in the right lower quadrant with no
evidence of appendicitis.  The terminal ileum is unremarkable.
There is some irregularity of the SI joints which may be related to
stress sclerosis.  Sacroiliitis is difficult to exclude.
IMPRESSION: 1.  No acute abnormality on CT of the abdomen and pelvis.
2.  Slightly prominent lymph nodes in the right lower quadrant
which are nonspecific.  Consider mesenteric adenitis.
3.  The appendix and terminal ileum appear normal.
4.  Slight irregularity of the SI joints possibly due to stress
sclerosis but sacroiliitis is difficult to exclude.
5.  Low attenuation collection  adjacent to the right heart border
anteriorly probably represents a small pericardial cyst.

## 2011-11-15 DIAGNOSIS — M459 Ankylosing spondylitis of unspecified sites in spine: Secondary | ICD-10-CM | POA: Insufficient documentation

## 2011-11-15 DIAGNOSIS — I10 Essential (primary) hypertension: Secondary | ICD-10-CM | POA: Insufficient documentation

## 2011-11-15 DIAGNOSIS — K219 Gastro-esophageal reflux disease without esophagitis: Secondary | ICD-10-CM | POA: Insufficient documentation

## 2012-02-24 ENCOUNTER — Telehealth (INDEPENDENT_AMBULATORY_CARE_PROVIDER_SITE_OTHER): Payer: Self-pay

## 2012-02-24 NOTE — Telephone Encounter (Signed)
Per Dr Ardine Eng request I  left msg for pt to call with update on wd. Pt returned my call and states are is much improved. She states cellulitis is resolving. Pt states since she has hx of lymphedema in this arm she does not want surgery unless the antibiotic does not control the infection. Pt advised if area does not continue to improve she needs to be seen in our urgent office this week and not wait until 03-04-12. 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

## 2012-03-04 ENCOUNTER — Encounter (INDEPENDENT_AMBULATORY_CARE_PROVIDER_SITE_OTHER): Payer: Self-pay | Admitting: Surgery

## 2012-12-16 DIAGNOSIS — M8580 Other specified disorders of bone density and structure, unspecified site: Secondary | ICD-10-CM | POA: Insufficient documentation

## 2013-01-29 ENCOUNTER — Other Ambulatory Visit: Payer: Self-pay | Admitting: Internal Medicine

## 2013-02-10 ENCOUNTER — Ambulatory Visit
Admission: RE | Admit: 2013-02-10 | Discharge: 2013-02-10 | Disposition: A | Discharge status: Home / Self Care | Payer: BC Managed Care – PPO | Source: Ambulatory Visit | Attending: Internal Medicine | Admitting: Internal Medicine

## 2013-02-10 MED ORDER — GADOBENATE DIMEGLUMINE 529 MG/ML IV SOLN
15.0000 mL | Freq: Once | INTRAVENOUS | Status: AC | PRN
  Administered 2013-02-10: 15 mL via INTRAVENOUS

## 2014-06-02 ENCOUNTER — Other Ambulatory Visit: Payer: Self-pay

## 2014-06-02 DIAGNOSIS — Z1231 Encounter for screening mammogram for malignant neoplasm of breast: Secondary | ICD-10-CM

## 2014-06-03 ENCOUNTER — Other Ambulatory Visit: Payer: Self-pay

## 2014-06-03 ENCOUNTER — Ambulatory Visit
Admission: RE | Admit: 2014-06-03 | Discharge: 2014-06-03 | Disposition: A | Discharge status: Home / Self Care | Payer: BC Managed Care – PPO | Source: Ambulatory Visit

## 2014-06-03 DIAGNOSIS — Z1231 Encounter for screening mammogram for malignant neoplasm of breast: Secondary | ICD-10-CM

## 2014-08-07 DIAGNOSIS — L03313 Cellulitis of chest wall: Secondary | ICD-10-CM | POA: Insufficient documentation

## 2018-01-28 LAB — HM MAMMOGRAPHY

## 2018-11-17 DIAGNOSIS — M170 Bilateral primary osteoarthritis of knee: Secondary | ICD-10-CM | POA: Insufficient documentation

## 2021-04-20 ENCOUNTER — Ambulatory Visit: Payer: 59 | Admitting: Nurse Practitioner

## 2021-04-20 ENCOUNTER — Other Ambulatory Visit: Payer: Self-pay

## 2021-04-20 ENCOUNTER — Encounter: Payer: Self-pay | Admitting: Nurse Practitioner

## 2021-04-20 VITALS — BP 126/84 | HR 92 | Temp 98.1°F | Resp 16 | Ht 67.0 in | Wt 165.2 lb

## 2021-04-20 DIAGNOSIS — Z853 Personal history of malignant neoplasm of breast: Secondary | ICD-10-CM

## 2021-04-20 DIAGNOSIS — Z131 Encounter for screening for diabetes mellitus: Secondary | ICD-10-CM

## 2021-04-20 DIAGNOSIS — R0683 Snoring: Secondary | ICD-10-CM

## 2021-04-20 DIAGNOSIS — Z1211 Encounter for screening for malignant neoplasm of colon: Secondary | ICD-10-CM

## 2021-04-20 DIAGNOSIS — K219 Gastro-esophageal reflux disease without esophagitis: Secondary | ICD-10-CM

## 2021-04-20 DIAGNOSIS — G4700 Insomnia, unspecified: Secondary | ICD-10-CM

## 2021-04-20 DIAGNOSIS — E785 Hyperlipidemia, unspecified: Secondary | ICD-10-CM | POA: Insufficient documentation

## 2021-04-20 DIAGNOSIS — E782 Mixed hyperlipidemia: Secondary | ICD-10-CM

## 2021-04-20 DIAGNOSIS — Z79899 Other long term (current) drug therapy: Secondary | ICD-10-CM

## 2021-04-20 DIAGNOSIS — I10 Essential (primary) hypertension: Secondary | ICD-10-CM | POA: Diagnosis not present

## 2021-04-20 DIAGNOSIS — F32A Depression, unspecified: Secondary | ICD-10-CM | POA: Diagnosis not present

## 2021-04-20 DIAGNOSIS — Z1231 Encounter for screening mammogram for malignant neoplasm of breast: Secondary | ICD-10-CM

## 2021-04-20 DIAGNOSIS — D649 Anemia, unspecified: Secondary | ICD-10-CM | POA: Insufficient documentation

## 2021-04-20 DIAGNOSIS — R1013 Epigastric pain: Secondary | ICD-10-CM | POA: Insufficient documentation

## 2021-04-20 DIAGNOSIS — L0390 Cellulitis, unspecified: Secondary | ICD-10-CM | POA: Insufficient documentation

## 2021-04-20 MED ORDER — LOVASTATIN 20 MG PO TABS: 20.0000 mg | ORAL_TABLET | Freq: Every day | ORAL | 3 refills | Status: DC

## 2021-04-20 NOTE — Progress Notes (Addendum)
BP 126/84    Pulse 92    Temp 98.1 F (36.7 C) (Oral)    Resp 16    Ht 5\' 7"  (1.702 m)    Wt 165 lb 3.2 oz (74.9 kg)    SpO2 98%    BMI 25.87 kg/m    Subjective:    Patient ID: Kristin Hobbs, female    DOB: 01-03-1961, 61 y.o.   MRN: [id number]  HPI: Kristin Hobbs is a 61 y.o. female, here alone  Chief Complaint  Patient presents with   Establish Care   Establish care:  Last physical  Dr. Jani Gravel Lady [place]), last July. She also sees rheumatologist yearly,last seen  summer 2022, at  [facility].   History of breast cancer: 2001 breast cancer in left breast. She received cancer-treatment at [facility] left mastectomy, and chemo and radiation- lymphedema in left arm. Total hysterectomy done 2002. Hernia repair Tamoxifen, Femara oral treatment through [facility] oncologist.  Done with treatment. Has not had a mammogram since Covid.   Depression: She was on lexapro while she was battling breast cancer.  She does not take it anymore. She says she weaned herself off and is doing well.  Depression screen [facility] 2/9 04/20/2021  Decreased Interest 0  Down, Depressed, Hopeless 0  PHQ - 2 Score 0  Altered sleeping 2  Tired, decreased energy 0  Change in appetite 0  Feeling bad or failure about yourself  0  Trouble concentrating 0  Moving slowly or fidgety/restless 0  Suicidal thoughts 0  PHQ-9 Score 2  Difficult doing work/chores Not difficult at all     Htn: She says before she was diagnosed with hypertension her blood pressure would run 140s/90s with headaches.  She started taking hydrochlorothiazide 25 mg daily. She says she no longer has headaches and her blood pressure has been fine.   Snoring: She says that she has been told that she snores.  She would like to have a at home sleep study.  ESS is 8.  Will send in referral for sleep study.  Hyperlipidemia:  she is currently taking lovastatin 20 mg daily. She denies any myalgia.  I do not have any recent labs, will get labs today.  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  She says she does not usually take her [place] she only takes it once in awhile, if she really having trouble falling asleep.  GERD: she says she takes protonix 40 mg two times a day.  She says that her acid reflux is well controlled if she takes her medication.   Ankylosing spondylitis:  She says she sees Dr. Meda Coffee, rheumatology once a year at [facility].  She is currently taking flexeril and cosentyx.  She says she is doing well with the treatment.   Relevant past medical, surgical, family and social history reviewed and updated as indicated. Interim medical history since our last visit reviewed. Allergies and medications reviewed and updated.  Review of Systems  Constitutional: Negative for fever or weight change.  Respiratory: Negative for cough and shortness of breath.   Cardiovascular: Negative for chest pain or palpitations.  Gastrointestinal: Negative for abdominal pain, no bowel changes.  Musculoskeletal: Negative for gait problem or joint swelling.  Skin: Negative for rash.  Neurological: Negative for dizziness or headache.  No other specific complaints in a complete review of systems (except as listed in HPI above).  Objective:    BP 126/84    Pulse 92    Temp 98.1 F (36.7 C) (Oral)    Resp 16    Ht 5\' 7"  (1.702 m)    Wt 165 lb 3.2 oz (74.9 kg)    SpO2 98%    BMI 25.87 kg/m   Wt Readings from Last 3 Encounters:  04/20/21 165 lb 3.2 oz (74.9 kg)    Physical Exam  Constitutional: Patient appears well-developed and well-nourished. Obese  No distress.  HEENT: head atraumatic, normocephalic, pupils equal and reactive to light,  neck supple, throat within normal limits Cardiovascular: Normal rate, regular rhythm and normal heart sounds.  No murmur heard. No BLE edema. Pulmonary/Chest: Effort normal and breath sounds normal. No respiratory distress. Abdominal: Soft.  There is no  tenderness. Psychiatric: Patient has a normal mood and affect. behavior is normal. Judgment and thought content normal.     Assessment & Plan:   1. History of breast cancer  - MM DIAG BREAST TOMO BILATERAL; Future  2. Depression, unspecified depression type -continue to monitor symptoms  3. Essential hypertension  - CBC with Differential/Platelet - COMPLETE METABOLIC PANEL WITH GFR  4. Snoring  - Ambulatory referral to Pulmonology  5. Mixed hyperlipidemia  - Lipid panel - COMPLETE METABOLIC PANEL WITH GFR - lovastatin (MEVACOR) 20 MG tablet; Take 1 tablet (20 mg total) by mouth daily.  Dispense: 90 tablet; Refill: 3  6. Insomnia, unspecified type -use ambien as needed  7. Gastroesophageal reflux disease without esophagitis -continue current treatment  8. Screening for diabetes mellitus  - Hemoglobin A1c  9. Long-term use of high-risk medication  - Lipid panel - CBC with Differential/Platelet - COMPLETE METABOLIC PANEL WITH GFR  10. Screening for colon cancer  - Ambulatory referral to Gastroenterology  11. Encounter for screening mammogram for malignant neoplasm of breast  - MM DIAG BREAST TOMO UNI RIGHT; Future   Follow up plan: Return in about 6 months (around 10/18/2021) for follow up.

## 2021-04-21 LAB — LIPID PANEL
Cholesterol: 201 mg/dL — ABNORMAL HIGH (ref <200)
HDL: 67 mg/dL (ref >=50)
LDL Cholesterol (Calc): 116 mg/dL (calc) — ABNORMAL HIGH
Non-HDL Cholesterol (Calc): 134 mg/dL (calc) — ABNORMAL HIGH (ref <130)
Total CHOL/HDL Ratio: 3 (calc) (ref <5.0)
Triglycerides: 81 mg/dL (ref <150)

## 2021-04-21 LAB — COMPLETE METABOLIC PANEL WITH GFR
AG Ratio: 1.5 (calc) (ref 1.0–2.5)
ALT: 11 U/L (ref 6–29)
AST: 18 U/L (ref 10–35)
Albumin: 3.9 g/dL (ref 3.6–5.1)
Alkaline phosphatase (APISO): 48 U/L (ref 37–153)
BUN: 14 mg/dL (ref 7–25)
CO2: 27 mmol/L (ref 20–32)
Calcium: 9.4 mg/dL (ref 8.6–10.4)
Chloride: 107 mmol/L (ref 98–110)
Creat: 0.93 mg/dL (ref 0.50–1.03)
Globulin: 2.6 g/dL (calc) (ref 1.9–3.7)
Glucose, Bld: 80 mg/dL (ref 65–99)
Potassium: 3.4 mmol/L — ABNORMAL LOW (ref 3.5–5.3)
Sodium: 141 mmol/L (ref 135–146)
Total Bilirubin: 0.7 mg/dL (ref 0.2–1.2)
Total Protein: 6.5 g/dL (ref 6.1–8.1)
eGFR: 71 mL/min/{1.73_m2} (ref >=60)

## 2021-04-21 LAB — CBC WITH DIFFERENTIAL/PLATELET
Absolute Monocytes: 390 cells/uL (ref 200–950)
Basophils Absolute: 42 cells/uL (ref 0–200)
Basophils Relative: 0.8 %
Eosinophils Absolute: 99 cells/uL (ref 15–500)
Eosinophils Relative: 1.9 %
HCT: 40.4 % (ref 35.0–45.0)
Hemoglobin: 13.4 g/dL (ref 11.7–15.5)
Lymphs Abs: 1451 cells/uL (ref 850–3900)
MCH: 29.5 pg (ref 27.0–33.0)
MCHC: 33.2 g/dL (ref 32.0–36.0)
MCV: 89 fL (ref 80.0–100.0)
MPV: 12.7 fL — ABNORMAL HIGH (ref 7.5–12.5)
Monocytes Relative: 7.5 %
Neutro Abs: 3219 cells/uL (ref 1500–7800)
Neutrophils Relative %: 61.9 %
Platelets: 204 10*3/uL (ref 140–400)
RBC: 4.54 10*6/uL (ref 3.80–5.10)
RDW: 13.5 % (ref 11.0–15.0)
Total Lymphocyte: 27.9 %
WBC: 5.2 10*3/uL (ref 3.8–10.8)

## 2021-04-21 LAB — HEMOGLOBIN A1C
Hgb A1c MFr Bld: 5.6 % of total Hgb (ref <5.7)
Mean Plasma Glucose: 114 mg/dL
eAG (mmol/L): 6.3 mmol/L

## 2021-04-23 ENCOUNTER — Telehealth: Payer: Self-pay

## 2021-04-23 NOTE — Telephone Encounter (Signed)
CALLED PATIENT NO ANSWER LEFT VOICEMAIL FOR A CALL BACK To schedule colonoscopy

## 2021-04-23 NOTE — Addendum Note (Signed)
Addended by: Serafina Royals F on: 04/23/2021 10:19 AM   Modules accepted: Orders

## 2021-04-24 ENCOUNTER — Telehealth: Payer: Self-pay

## 2021-04-24 NOTE — Telephone Encounter (Signed)
CALLED PATIENT NO ANSWER LEFT VOICEMAIL FOR A CALL BACK LETTER SENT 

## 2021-04-25 ENCOUNTER — Other Ambulatory Visit: Payer: Self-pay

## 2021-04-25 ENCOUNTER — Telehealth: Payer: Self-pay

## 2021-04-25 DIAGNOSIS — Z1211 Encounter for screening for malignant neoplasm of colon: Secondary | ICD-10-CM

## 2021-04-25 MED ORDER — SUTAB 1479-225-188 MG PO TABS: 12.0000 | ORAL_TABLET | Freq: Once | ORAL | 0 refills | Status: AC

## 2021-04-25 NOTE — Progress Notes (Signed)
Gastroenterology Pre-Procedure Review  Request Date: 05/09/2021 Requesting Physician: Dr. Vicente Males  PATIENT REVIEW QUESTIONS: The patient responded to the following health history questions as indicated:    1. Are you having any GI issues? yes (managed with medicine) 2. Do you have a personal history of Polyps? no 3. Do you have a family history of Colon Cancer or Polyps? yes (sister colon cancer) 4. Diabetes Mellitus? no 5. Joint replacements in the past 12 months?no 6. Major health problems in the past 3 months?no 7. Any artificial heart valves, MVP, or defibrillator?no    MEDICATIONS & ALLERGIES:    Patient reports the following regarding taking any anticoagulation/antiplatelet therapy:   Plavix, Coumadin, Eliquis, Xarelto, Lovenox, Pradaxa, Brilinta, or Effient? no Aspirin? no  Patient confirms/reports the following medications:  Current Outpatient Medications  Medication Sig Dispense Refill   Calcium Carb-Cholecalciferol 600-5 MG-MCG TABS 1 tablet with food     cyclobenzaprine (FLEXERIL) 10 MG tablet TAKE 1 TABLET BY MOUTH THREE TIMES A DAY AS NEEDED FOR MUSCLE SPASMS.     hydrochlorothiazide (HYDRODIURIL) 25 MG tablet 1 tablet in the morning     lovastatin (MEVACOR) 20 MG tablet Take 1 tablet (20 mg total) by mouth daily. 90 tablet 3   MULTIPLE VITAMIN-FOLIC ACID PO 1 tablet     pantoprazole (PROTONIX) 40 MG tablet 40 mg 2 (two) times daily.     Secukinumab (COSENTYX) 150 MG/ML SOSY 1 ml     topiramate (TOPAMAX) 50 MG tablet 1 tablet     zolpidem (AMBIEN) 10 MG tablet Take 10 mg by mouth at bedtime as needed for sleep.     No current facility-administered medications for this visit.    Patient confirms/reports the following allergies:  Allergies  Allergen Reactions   Losartan Potassium Rash    No orders of the defined types were placed in this encounter.   AUTHORIZATION INFORMATION Primary Insurance: 1D#: Group #:  Secondary Insurance: 1D#: Group #:  SCHEDULE  INFORMATION: Date: 05/09/2021 Time: Location:[facility]

## 2021-04-25 NOTE — Telephone Encounter (Signed)
Scheduled for 05/09/2021

## 2021-04-30 ENCOUNTER — Telehealth: Payer: Self-pay

## 2021-04-30 NOTE — Telephone Encounter (Signed)
Called and LCM in regards to upcoming appt. Would like to know if patient has ever had a sleep study before. Nothing further needed.

## 2021-05-01 ENCOUNTER — Other Ambulatory Visit: Payer: Self-pay

## 2021-05-01 ENCOUNTER — Encounter: Payer: Self-pay | Admitting: Adult Health

## 2021-05-01 ENCOUNTER — Telehealth: Payer: Self-pay | Admitting: Adult Health

## 2021-05-01 ENCOUNTER — Ambulatory Visit (INDEPENDENT_AMBULATORY_CARE_PROVIDER_SITE_OTHER): Payer: 59 | Admitting: Adult Health

## 2021-05-01 DIAGNOSIS — G4733 Obstructive sleep apnea (adult) (pediatric): Secondary | ICD-10-CM

## 2021-05-01 NOTE — Assessment & Plan Note (Addendum)
History of Moderate OSA - will bring copy of sleep study by our office for records Will repeat sleep study. Continues to have daytime sleepiness  Once results are available will proceed with treatment options  - discussed how weight can impact sleep and risk for sleep disordered breathing - discussed options to assist with weight loss: combination of diet modification, cardiovascular and strength training exercises   - had an extensive discussion regarding the adverse health consequences related to untreated sleep disordered breathing - specifically discussed the risks for hypertension, coronary artery disease, cardiac dysrhythmias, cerebrovascular disease, and diabetes - lifestyle modification discussed   - discussed how sleep disruption can increase risk of accidents, particularly when driving - safe driving practices were discussed      Plan  Patient Instructions  Set up for home sleep study .  Work on healthy weight  Do not drive if sleepy  Follow up in 2-3 months to discuss results .

## 2021-05-01 NOTE — Patient Instructions (Signed)
Set up for home sleep study .  Work on healthy weight  Do not drive if sleepy  Follow up in 2-3 months to discuss results .

## 2021-05-01 NOTE — Telephone Encounter (Signed)
Rodena Piety, can you contact patient's insurance to find out if they will accept home sleep study for inspire device? Thanks

## 2021-05-01 NOTE — Progress Notes (Signed)
_0  ID: Kristin Hobbs, female    DOB: 1960-11-26, 61 y.o.   MRN: [id number]  Chief Complaint  Patient presents with   Consult    Referring provider: No ref. provider found  HPI: 61 year old female presents for a sleep consult May 01, 2021 for snoring and daytime sleepiness Medical history significant for breast cancer 2001 and one of the left breast status postmastectomy, chemo and radiation.  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  Patient was referred by her primary care provider.  Patient has history of insomnia and uses Ambien as needed.  She has daytime sleepiness and snoring.  Epworth score is 7.  Patient has sleepiness with sitting, watching TV, riding in a car as a passenger, and can take a nap if able.  Patient has restless sleep and loud snoring. Feels tired and sleepy during daytime . Unrefreshed upon awakening  Typically goes to bed about 10 PM.  Takes about an hour to go to sleep.  Is up 4-5 times a night.  Gets up around 5 AM to 9 AM depending on work.  She does not operate heavy machinery.  Weight is down  10 to 30 pounds over the last 2 years.  Patient says she has had a sleep study in the past and been told that she had moderate sleep apnea but she was unable to tolerate. She wants to look into INSPIRE device. . Minimal caffeine use. No symptoms suspicious for Cataplexy or sleep paralysis   Medical history is significant for breast cancer in 2001 status post left mastectomy.  She received chemo and radiation.  She did have left arm lymphedema.  She also has a history of ankylosing spondylitis.followed by Rheumatology ,  Hypertension, hyperlipidemia,.  Surgical history as above breast cancer surgery on the left with mastectomy.  And hysterectomy.  Family history is positive for ankylosing spondylitis.  Social history.  Patient  is a travel Therapist, sports   She lives with her son and mother.  She is had no recent travel.  She is divorced.  Never smoker.    Allergies  Allergen Reactions   Losartan Potassium Rash    Immunization History  Administered Date(s) Administered   DTaP 04/02/2007   Influenza Split 02/27/2004, 01/23/2012   Influenza, High Dose Seasonal PF 12/22/2013   Influenza, Quadrivalent, Recombinant, Inj, Pf 01/30/2017, 02/09/2018, 01/19/2019, 12/01/2019, 12/21/2020   MMR 01/26/2019, 05/12/2019, 02/14/2021   Pneumococcal Polysaccharide-23 04/01/2008   Tdap 06/01/2020    Past Medical History:  Diagnosis Date   Cancer ([place])    GERD (gastroesophageal reflux disease)    Headache    Hypertension    Insomnia    Muscle spasm    OSA (obstructive sleep apnea)     Tobacco History: Social History   Tobacco Use  Smoking Status Never  Smokeless Tobacco Never   Counseling given: Not Answered   Outpatient Medications Prior to Visit  Medication Sig Dispense Refill   Calcium Carb-Cholecalciferol 600-5 MG-MCG TABS 1 tablet with food     cyclobenzaprine (FLEXERIL) 10 MG tablet TAKE 1 TABLET BY MOUTH THREE TIMES A DAY AS NEEDED FOR MUSCLE SPASMS.     hydrochlorothiazide (HYDRODIURIL) 25 MG tablet 1 tablet in the morning     lovastatin (MEVACOR) 20 MG tablet Take 1 tablet (20 mg total) by mouth daily. 90 tablet 3   MULTIPLE VITAMIN-FOLIC ACID  PO 1 tablet     pantoprazole (PROTONIX) 40 MG tablet 40 mg 2 (two) times daily.     Secukinumab (COSENTYX) 150 MG/ML SOSY 1 ml     topiramate (TOPAMAX) 50 MG tablet 1 tablet     zolpidem (AMBIEN) 10 MG tablet Take 10 mg by mouth at bedtime as needed for sleep.     No facility-administered medications prior to visit.     Review of Systems:   Constitutional:   No  weight loss, night sweats,  Fevers, chills, + fatigue, or  lassitude.  HEENT:   No headaches,  Difficulty swallowing,  Tooth/dental problems, or  Sore throat,                No sneezing, itching, ear  ache, nasal congestion, post nasal drip,   CV:  No chest pain,  Orthopnea, PND, swelling in lower extremities, anasarca, dizziness, palpitations, syncope.   GI  No heartburn, indigestion, abdominal pain, nausea, vomiting, diarrhea, change in bowel habits, loss of appetite, bloody stools.   Resp: No shortness of breath with exertion or at rest.  No excess mucus, no productive cough,  No non-productive cough,  No coughing up of blood.  No change in color of mucus.  No wheezing.  No chest wall deformity  Skin: no rash or lesions.  GU: no dysuria, change in color of urine, no urgency or frequency.  No flank pain, no hematuria   MS:  No joint pain or swelling.  No decreased range of motion.  No back pain.    Physical Exam  BP 104/74 (BP Location: Left Arm, Patient Position: Sitting, Cuff Size: Normal)    Pulse 80    Temp 98 F (36.7 C) (Oral)    Ht 5' 6.5" (1.689 m)    Wt 165 lb 6.4 oz (75 kg)    SpO2 98%    BMI 26.30 kg/m   GEN: A/Ox3; pleasant , NAD, well nourished    HEENT:  [place]/AT,  EACs-clear, TMs-wnl, NOSE-clear, THROAT-clear, no lesions, no postnasal drip or exudate noted. Class 2 MP airway   NECK:  Supple w/ fair ROM; no JVD; normal carotid impulses w/o bruits; no thyromegaly or nodules palpated; no lymphadenopathy.    RESP  Clear  P & A; w/o, wheezes/ rales/ or rhonchi. no accessory muscle use, no dullness to percussion  CARD:  RRR, no m/r/g, no peripheral edema, pulses intact, no cyanosis or clubbing.  GI:   Soft & nt; nml bowel sounds; no organomegaly or masses detected.   Musco: Warm bil, no deformities or joint swelling noted.   Neuro: alert, no focal deficits noted.    Skin: Warm, no lesions or rashes    Lab Results:    BNP No results found for: BNP  ProBNP No results found for: PROBNP  Imaging: No results found.    No flowsheet data found.  No results found for: NITRICOXIDE      Assessment & Plan:   OSA (obstructive sleep apnea) History of  Moderate OSA - will bring copy of sleep study by our office for records Will repeat sleep study. Continues to have daytime sleepiness  Once results are available will proceed with treatment options  - discussed how weight can impact sleep and risk for sleep disordered breathing - discussed options to assist with weight loss: combination of diet modification, cardiovascular and strength training exercises   - had an extensive discussion regarding the adverse health consequences related to untreated sleep disordered breathing -  specifically discussed the risks for hypertension, coronary artery disease, cardiac dysrhythmias, cerebrovascular disease, and diabetes - lifestyle modification discussed   - discussed how sleep disruption can increase risk of accidents, particularly when driving - safe driving practices were discussed      Plan  Patient Instructions  Set up for home sleep study .  Work on healthy weight  Do not drive if sleepy  Follow up in 2-3 months to discuss results .         Rexene Edison, NP 05/01/2021

## 2021-05-01 NOTE — Progress Notes (Signed)
Reviewed and agree with assessment/plan.   Chesley Mires, MD [facility] Pulmonary/Critical Care 05/01/2021, 5:00 PM Pager:  [phone]

## 2021-05-02 NOTE — Telephone Encounter (Signed)
Per Anita-patient will need to call [facility] and have them investigate her insurance .  Lm for patient.

## 2021-05-03 ENCOUNTER — Telehealth: Payer: Self-pay | Admitting: Adult Health

## 2021-05-03 NOTE — Telephone Encounter (Signed)
Lm for patient.  

## 2021-05-03 NOTE — Telephone Encounter (Signed)
Please see 05/02/2021 phone note.

## 2021-05-03 NOTE — Telephone Encounter (Signed)
Spoke to patient, who stated that she contacted inspire and was advised to contact insurance. Insurance was not able to provide her with any information regarding preferred sleep study for inspire device.   Tammy, how would you like to proceed?

## 2021-05-03 NOTE — Telephone Encounter (Signed)
Lets proceed with Split night sleep study that should insure not issues if we need to proceed with Inspire .

## 2021-05-04 ENCOUNTER — Other Ambulatory Visit: Payer: Self-pay | Admitting: Nurse Practitioner

## 2021-05-04 DIAGNOSIS — Z1231 Encounter for screening mammogram for malignant neoplasm of breast: Secondary | ICD-10-CM

## 2021-05-04 NOTE — Telephone Encounter (Signed)
Patient is aware of recommendations and voiced her understanding.  She agrees with sleep study.  Split night has been ordered. Nothing further needed.

## 2021-05-07 ENCOUNTER — Other Ambulatory Visit: Payer: Self-pay | Admitting: *Deleted

## 2021-05-07 ENCOUNTER — Inpatient Hospital Stay
Admission: RE | Admit: 2021-05-07 | Discharge: 2021-05-07 | Disposition: A | Discharge status: Home / Self Care | Payer: Self-pay | Source: Ambulatory Visit | Attending: *Deleted | Admitting: *Deleted

## 2021-05-07 DIAGNOSIS — Z1231 Encounter for screening mammogram for malignant neoplasm of breast: Secondary | ICD-10-CM

## 2021-05-09 ENCOUNTER — Ambulatory Visit
Admission: RE | Admit: 2021-05-09 | Discharge: 2021-05-09 | Disposition: A | Discharge status: Home / Self Care | Payer: 59 | Attending: Gastroenterology | Admitting: Gastroenterology

## 2021-05-09 ENCOUNTER — Other Ambulatory Visit: Payer: Self-pay

## 2021-05-09 ENCOUNTER — Encounter: Payer: Self-pay | Admitting: Gastroenterology

## 2021-05-09 ENCOUNTER — Ambulatory Visit: Payer: 59 | Admitting: Anesthesiology

## 2021-05-09 ENCOUNTER — Encounter
Admission: RE | Disposition: A | Discharge status: Home / Self Care | Payer: Self-pay | Source: Home / Self Care | Attending: Gastroenterology

## 2021-05-09 DIAGNOSIS — Z1211 Encounter for screening for malignant neoplasm of colon: Secondary | ICD-10-CM | POA: Diagnosis not present

## 2021-05-09 DIAGNOSIS — K5730 Diverticulosis of large intestine without perforation or abscess without bleeding: Secondary | ICD-10-CM | POA: Insufficient documentation

## 2021-05-09 DIAGNOSIS — K219 Gastro-esophageal reflux disease without esophagitis: Secondary | ICD-10-CM | POA: Insufficient documentation

## 2021-05-09 DIAGNOSIS — G4733 Obstructive sleep apnea (adult) (pediatric): Secondary | ICD-10-CM | POA: Insufficient documentation

## 2021-05-09 DIAGNOSIS — Z90710 Acquired absence of both cervix and uterus: Secondary | ICD-10-CM | POA: Diagnosis not present

## 2021-05-09 DIAGNOSIS — I10 Essential (primary) hypertension: Secondary | ICD-10-CM | POA: Diagnosis not present

## 2021-05-09 DIAGNOSIS — G4700 Insomnia, unspecified: Secondary | ICD-10-CM | POA: Diagnosis not present

## 2021-05-09 HISTORY — PX: COLONOSCOPY WITH PROPOFOL: SHX5780

## 2021-05-09 SURGERY — COLONOSCOPY WITH PROPOFOL
Anesthesia: General

## 2021-05-09 MED ORDER — PROPOFOL 10 MG/ML IV BOLUS
INTRAVENOUS | Status: DC | PRN
  Administered 2021-05-09: 100 mg via INTRAVENOUS

## 2021-05-09 MED ORDER — PROPOFOL 10 MG/ML IV BOLUS
INTRAVENOUS | Status: AC
  Filled 2021-05-09: qty 40

## 2021-05-09 MED ORDER — PROPOFOL 500 MG/50ML IV EMUL
INTRAVENOUS | Status: DC | PRN
  Administered 2021-05-09: 165 ug/kg/min via INTRAVENOUS

## 2021-05-09 MED ORDER — SODIUM CHLORIDE 0.9 % IV SOLN: INTRAVENOUS | Status: DC

## 2021-05-09 NOTE — H&P (Signed)
Jonathon Bellows, MD [address] [place], [place], [place], [address] [address], [place], [place], [place], [address] Phone: [phone]  Fax: [phone]  Primary Care Physician:  Bo Merino, FNP   Pre-Procedure History & Physical: HPI:  Kristin Hobbs is a 61 y.o. female is here for an colonoscopy.   Past Medical History:  Diagnosis Date   Cancer ([place])    GERD (gastroesophageal reflux disease)    Headache    Hypertension    Insomnia    Muscle spasm    OSA (obstructive sleep apnea)     Past Surgical History:  Procedure Laterality Date   ABDOMINAL HYSTERECTOMY     vaginal hysterectomy , TRAMFLAP on abdomen   BREAST SURGERY Left     Prior to Admission medications   Medication Sig Start Date End Date Taking? Authorizing Provider  hydrochlorothiazide (HYDRODIURIL) 25 MG tablet 1 tablet in the morning 01/16/18  Yes [provider]  lovastatin (MEVACOR) 20 MG tablet Take 1 tablet (20 mg total) by mouth daily. 04/20/21  Yes Bo Merino, FNP  pantoprazole (PROTONIX) 40 MG tablet 40 mg 2 (two) times daily. 01/20/18  Yes [provider]  topiramate (TOPAMAX) 50 MG tablet 1 tablet   Yes [provider]  Calcium Carb-Cholecalciferol 600-5 MG-MCG TABS 1 tablet with food    [provider]  cyclobenzaprine (FLEXERIL) 10 MG tablet TAKE 1 TABLET BY MOUTH THREE TIMES A DAY AS NEEDED FOR MUSCLE SPASMS. 07/20/18   [provider]  MULTIPLE VITAMIN-FOLIC ACID PO 1 tablet    [provider]  Secukinumab (COSENTYX) 150 MG/ML SOSY 1 ml 01/06/19   [provider]  zolpidem (AMBIEN) 10 MG tablet Take 10 mg by mouth at bedtime as needed for sleep.    [provider]    Allergies as of 04/25/2021 - Review Complete 04/25/2021  Allergen Reaction Noted   Losartan potassium Rash 06/01/2020    Family History  Problem Relation Age of Onset   Hypertension Mother    Hyperlipidemia Mother    Hypertension Father     Hyperlipidemia Father     Social History   Socioeconomic History   Marital status: Divorced    Spouse name: Not on file   Number of children: Not on file   Years of education: Not on file   Highest education level: Not on file  Occupational History   Not on file  Tobacco Use   Smoking status: Never   Smokeless tobacco: Never  Vaping Use   Vaping Use: Never used  Substance and Sexual Activity   Alcohol use: Never   Drug use: Never   Sexual activity: Not Currently    Partners: Female  Other Topics Concern   Not on file  Social History Narrative   Not on file   Social Determinants of Health   Financial Resource Strain: Not on file  Food Insecurity: Not on file  Transportation Needs: Not on file  Physical Activity: Not on file  Stress: Not on file  Social Connections: Not on file  Intimate Partner Violence: Not on file    Review of Systems: See HPI, otherwise negative ROS  Physical Exam: BP 119/83    Pulse 69    Temp (!) 96.2 F (35.7 C) (Temporal)    Resp 18    Ht 5\' 6"  (1.676 m)    Wt 72.6 kg    SpO2 100%    BMI 25.82 kg/m  General:  Alert,  pleasant and cooperative in NAD Head:  Normocephalic and atraumatic. Neck:  Supple; no masses or thyromegaly. Lungs:  Clear throughout to auscultation, normal respiratory effort.    Heart:  +S1, +S2, Regular rate and rhythm, No edema. Abdomen:  Soft, nontender and nondistended. Normal bowel sounds, without guarding, and without rebound.   Neurologic:  Alert and  oriented x4;  grossly normal neurologically.  Impression/Plan: Kristin Hobbs is here for an colonoscopy to be performed for Screening colonoscopy average risk   Risks, benefits, limitations, and alternatives regarding  colonoscopy have been reviewed with the patient.  Questions have been answered.  All parties agreeable.   Jonathon Bellows, MD  05/09/2021, 9:26 AM

## 2021-05-09 NOTE — Op Note (Signed)
[facility] Gastroenterology Patient Name: Kristin Hobbs Procedure Date: 05/09/2021 9:27 AM MRN: [id number] Account #: [id number] Date of Birth: June 29, 1960 Admit Type: Outpatient Age: 61 Room: [facility] ENDO ROOM 3 Gender: Female Note Status: Finalized Instrument Name: Jasper Riling [id number] Procedure:             Colonoscopy Indications:           Screening for colorectal malignant neoplasm Providers:             Jonathon Bellows MD, MD Referring MD:          Jennell Corner, FNP Medicines:             Monitored Anesthesia Care Complications:         No immediate complications. Procedure:             Pre-Anesthesia Assessment:                        - Prior to the procedure, a History and Physical was                         performed, and patient medications, allergies and                         sensitivities were reviewed. The patient's tolerance                         of previous anesthesia was reviewed.                        - The risks and benefits of the procedure and the                         sedation options and risks were discussed with the                         patient. All questions were answered and informed                         consent was obtained.                        - ASA Grade Assessment: II - A patient with mild                         systemic disease.                        After obtaining informed consent, the colonoscope was                         passed under direct vision. Throughout the procedure,                         the patient's blood pressure, pulse, and oxygen                         saturations were monitored continuously. The                         Colonoscope was introduced through the  anus and                         advanced to the the cecum, identified by the                         appendiceal orifice. The colonoscopy was performed                         with ease. The patient tolerated the procedure well.                          The quality of the bowel preparation was good. Findings:      The perianal and digital rectal examinations were normal.      Multiple small-mouthed diverticula were found in the sigmoid colon.      The exam was otherwise without abnormality on direct and retroflexion       views. Impression:            - Diverticulosis in the sigmoid colon.                        - The examination was otherwise normal on direct and                         retroflexion views.                        - No specimens collected. Recommendation:        - Discharge patient to home (with escort).                        - Resume previous diet.                        - Continue present medications.                        - Repeat colonoscopy in 10 years for screening                         purposes. Procedure Code(s):     --- Professional ---                        [phone], Colonoscopy, flexible; diagnostic, including                         collection of specimen(s) by brushing or washing, when                         performed (separate procedure) Diagnosis Code(s):     --- Professional ---                        Z12.11, Encounter for screening for malignant neoplasm                         of colon                        K57.30, Diverticulosis of large intestine without  perforation or abscess without bleeding CPT copyright 2019 American Medical Association. All rights reserved. The codes documented in this report are preliminary and upon coder review may  be revised to meet current compliance requirements. Jonathon Bellows, MD Jonathon Bellows MD, MD 05/09/2021 10:02:39 AM This report has been signed electronically. Number of Addenda: 0 Note Initiated On: 05/09/2021 9:27 AM Scope Withdrawal Time: 0 hours 12 minutes 2 seconds  Total Procedure Duration: 0 hours 20 minutes 43 seconds  Estimated Blood Loss:  Estimated blood loss: none.      [facility]

## 2021-05-09 NOTE — Transfer of Care (Signed)
Immediate Anesthesia Transfer of Care Note  Patient: Kristin Hobbs  Procedure(s) Performed: COLONOSCOPY WITH PROPOFOL  Patient Location: PACU and Endoscopy Unit  Anesthesia Type:General  Level of Consciousness: awake  Airway & Oxygen Therapy: Patient Spontanous Breathing  Post-op Assessment: Report given to RN and Post -op Vital signs reviewed and stable  Post vital signs: Reviewed and stable  Last Vitals:  Vitals Value Taken Time  BP    Temp    Pulse    Resp    SpO2      Last Pain:  Vitals:   05/09/21 0844  TempSrc: Temporal  PainSc: 0-No pain         Complications: No notable events documented.

## 2021-05-09 NOTE — Anesthesia Postprocedure Evaluation (Signed)
Anesthesia Post Note  Patient: Kristin Hobbs  Procedure(s) Performed: COLONOSCOPY WITH PROPOFOL  Patient location during evaluation: Endoscopy Anesthesia Type: General Level of consciousness: awake and alert Pain management: pain level controlled Vital Signs Assessment: post-procedure vital signs reviewed and stable Respiratory status: spontaneous breathing, nonlabored ventilation, respiratory function stable and patient connected to nasal cannula oxygen Cardiovascular status: blood pressure returned to baseline and stable Postop Assessment: no apparent nausea or vomiting Anesthetic complications: no   No notable events documented.   Last Vitals:  Vitals:   05/09/21 1013 05/09/21 1023  BP: 115/71 115/74  Pulse: 76 78  Resp: 16 18  Temp:    SpO2: 100% 100%    Last Pain:  Vitals:   05/09/21 1023  TempSrc:   PainSc: 0-No pain                 Precious Haws Narissa [place]

## 2021-05-09 NOTE — Anesthesia Preprocedure Evaluation (Signed)
Anesthesia Evaluation  Patient identified by MRN, date of birth, ID band Patient awake    Reviewed: Allergy & Precautions, NPO status , Patient's Chart, lab work & pertinent test results  History of Anesthesia Complications Negative for: history of anesthetic complications  Airway Mallampati: III  TM Distance: <3 FB Neck ROM: full    Dental  (+) Chipped   Pulmonary neg shortness of breath, sleep apnea ,    Pulmonary exam normal        Cardiovascular Exercise Tolerance: Good hypertension, Normal cardiovascular exam     Neuro/Psych  Headaches, PSYCHIATRIC DISORDERS    GI/Hepatic Neg liver ROS, GERD  Controlled,  Endo/Other  negative endocrine ROS  Renal/GU negative Renal ROS  negative genitourinary   Musculoskeletal  (+) Arthritis ,   Abdominal   Peds  Hematology negative hematology ROS (+)   Anesthesia Other Findings Past Medical History: No date: Cancer ([place]) No date: GERD (gastroesophageal reflux disease) No date: Headache No date: Hypertension No date: Insomnia No date: Muscle spasm No date: OSA (obstructive sleep apnea)  Past Surgical History: No date: ABDOMINAL HYSTERECTOMY     Comment:  vaginal hysterectomy , TRAMFLAP on abdomen No date: BREAST SURGERY; Left  BMI    Body Mass Index: 25.82 kg/m      Reproductive/Obstetrics negative OB ROS                             Anesthesia Physical Anesthesia Plan  ASA: 3  Anesthesia Plan: General   Post-op Pain Management:    Induction: Intravenous  PONV Risk Score and Plan: Propofol infusion and TIVA  Airway Management Planned: Natural Airway and Nasal Cannula  Additional Equipment:   Intra-op Plan:   Post-operative Plan:   Informed Consent: I have reviewed the patients History and Physical, chart, labs and discussed the procedure including the risks, benefits and alternatives for the proposed anesthesia with the  patient or authorized representative who has indicated his/her understanding and acceptance.     Dental Advisory Given  Plan Discussed with: Anesthesiologist, CRNA and Surgeon  Anesthesia Plan Comments: (Patient consented for risks of anesthesia including but not limited to:  - adverse reactions to medications - risk of airway placement if required - damage to eyes, teeth, lips or other oral mucosa - nerve damage due to positioning  - sore throat or hoarseness - Damage to heart, brain, nerves, lungs, other parts of body or loss of life  Patient voiced understanding.)        Anesthesia Quick Evaluation

## 2021-05-10 ENCOUNTER — Encounter: Payer: Self-pay | Admitting: Gastroenterology

## 2021-05-29 ENCOUNTER — Telehealth: Payer: Self-pay | Admitting: Adult Health

## 2021-05-29 DIAGNOSIS — G4733 Obstructive sleep apnea (adult) (pediatric): Secondary | ICD-10-CM

## 2021-05-29 NOTE — Telephone Encounter (Signed)
A split-night sleep study was set up.  Can we check to see if this was arranged.  According to my consult note patient told me she did not have a CPAP that she had been and able to tolerate this in the past.

## 2021-05-29 NOTE — Telephone Encounter (Signed)
Spoke to patient.  She is calling for update on HST. Rodena Piety, please advise. Thanks.  Patient also stated that her cpap machine is currently set at 14cm and she feels that it is too strong. She would like an auto pressure.  [id number].  Tammy, please advise. Thanks

## 2021-05-30 NOTE — Telephone Encounter (Signed)
Can change to Auto CPAP 5 to 12 cm H20.  ?Try to wear CPAP each night all night long .  ? ?

## 2021-05-30 NOTE — Telephone Encounter (Signed)
Order placed to [facility].  ?Patient is aware and voiced her understanding.  ?Nothing further needed.  ? ?

## 2021-05-30 NOTE — Telephone Encounter (Signed)
Spoke to patient.  ?She stated that [facility] has not contacted her.  ?She has a cpap and has not been able to tolerate it in the past, however she was told that she needed to wear it in order to get an inspire device.  ? ?Lm for [facility]. ? ?Tammy, please advise.  ? ? ?

## 2021-06-17 ENCOUNTER — Other Ambulatory Visit: Payer: Self-pay | Admitting: Nurse Practitioner

## 2021-06-18 ENCOUNTER — Other Ambulatory Visit: Payer: Self-pay

## 2021-06-18 MED ORDER — PANTOPRAZOLE SODIUM 40 MG PO TBEC
40.0000 mg | DELAYED_RELEASE_TABLET | Freq: Two times a day (BID) | ORAL | 1 refills | Status: DC
  Filled 2021-06-18: qty 180, 90d supply, fill #0

## 2021-06-18 MED ORDER — ZOLPIDEM TARTRATE 10 MG PO TABS
10.0000 mg | ORAL_TABLET | Freq: Every evening | ORAL | 1 refills | Status: DC | PRN
  Filled 2021-06-18: qty 30, 30d supply, fill #0

## 2021-06-18 MED ORDER — HYDROCHLOROTHIAZIDE 25 MG PO TABS
25.0000 mg | ORAL_TABLET | Freq: Every day | ORAL | 1 refills | Status: DC
  Filled 2021-06-18: qty 30, 30d supply, fill #0

## 2021-06-18 MED ORDER — TOPIRAMATE 50 MG PO TABS
50.0000 mg | ORAL_TABLET | Freq: Every day | ORAL | 1 refills | Status: DC
  Filled 2021-06-18: qty 90, 90d supply, fill #0

## 2021-06-20 ENCOUNTER — Other Ambulatory Visit: Payer: Self-pay | Admitting: Nurse Practitioner

## 2021-06-20 ENCOUNTER — Telehealth: Payer: Self-pay | Admitting: Nurse Practitioner

## 2021-06-20 DIAGNOSIS — K219 Gastro-esophageal reflux disease without esophagitis: Secondary | ICD-10-CM

## 2021-06-20 DIAGNOSIS — G4700 Insomnia, unspecified: Secondary | ICD-10-CM

## 2021-06-20 DIAGNOSIS — I10 Essential (primary) hypertension: Secondary | ICD-10-CM

## 2021-06-20 MED ORDER — ZOLPIDEM TARTRATE 10 MG PO TABS: 10.0000 mg | ORAL_TABLET | Freq: Every evening | ORAL | 1 refills | Status: DC | PRN

## 2021-06-20 MED ORDER — TOPIRAMATE 50 MG PO TABS: 50.0000 mg | ORAL_TABLET | Freq: Every day | ORAL | 1 refills | Status: DC

## 2021-06-20 MED ORDER — HYDROCHLOROTHIAZIDE 25 MG PO TABS: 25.0000 mg | ORAL_TABLET | Freq: Every day | ORAL | 1 refills | Status: DC

## 2021-06-20 MED ORDER — PANTOPRAZOLE SODIUM 40 MG PO TBEC: 40.0000 mg | DELAYED_RELEASE_TABLET | Freq: Two times a day (BID) | ORAL | 1 refills | Status: DC

## 2021-06-20 NOTE — Telephone Encounter (Signed)
Copied from [place] [id number]. Topic: General - Other ?>> Jun 20, 2021  9:58 AM Loma Boston wrote: ?Pt is calling in upset as the meds she had ordered were sent to [facility] and she has never used them and had requested wants documented incorrect, does not understand why done on [facility]  [facility], please resnd the following : ?hydrochlorothiazide (HYDRODIURIL) 25 MG tablet 90 tablet 1 06/18/2021   ?Sig - Route: Take 1 tablet (25 mg total) by mouth daily. - Oral  ?Sent to pharmacy as: hydrochlorothiazide (HYDRODIURIL) 25 MG tablet ? ?pantoprazole (PROTONIX) 40 MG tablet 180 tablet 1 06/18/2021   ?Sig - Route: Take 1 tablet (40 mg total) by mouth 2 (two) times daily. - Oral  ?Sent to pharmacy as: pantoprazole (PROTONIX) 40 MG tablet ? ?topiramate (TOPAMAX) 50 MG tablet 90 tablet 1 06/18/2021   ?Sig - Route: Take 1 tablet (50 mg total) by mouth daily. - Oral  ?Sent to pharmacy as: topiramate (TOPAMAX) 50 MG tablet ? ?zolpidem (AMBIEN) 10 MG tablet 30 tablet 1 06/18/2021   ?Sig - Route: Take 1 tablet (10 mg total) by mouth at bedtime as needed for sleep. - Oral  ?Sent to pharmacy as: zolpidem (AMBIEN) 10 MG tablet ? ?[id number] [place] [facility] [address] [id number]?Phone: [phone]: [phone]?

## 2021-07-04 ENCOUNTER — Encounter: Payer: Self-pay | Admitting: Nurse Practitioner

## 2021-07-09 DIAGNOSIS — G43909 Migraine, unspecified, not intractable, without status migrainosus: Secondary | ICD-10-CM | POA: Insufficient documentation

## 2021-07-17 ENCOUNTER — Telehealth: Payer: Self-pay | Admitting: Nurse Practitioner

## 2021-07-17 NOTE — Telephone Encounter (Signed)
Have you seen this ?

## 2021-07-17 NOTE — Telephone Encounter (Signed)
Pt stated she contacted her insurance directly and was advised that they just needed PA for the medication pantoprazole (PROTONIX) 40 MG tablet.  Pt stated she has been out of medication for about 4-6 weeks. 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 ? ?Pt stated her Haematologist faxed a PA form today for her PCP to fill out so she can get her medication. ? ?Pt is requesting a call back today. ?

## 2021-07-23 NOTE — Telephone Encounter (Signed)
Called and spoke to Dominica at [place]. Split the #180/1 refill into #60/5 refills same directions so insurance will cover. Patient notified  ?

## 2021-07-23 NOTE — Telephone Encounter (Signed)
Pt called about PA for Pantoprazole and stated she has been waiting for a few days / please advise pt of PA status ?

## 2021-07-23 NOTE — Telephone Encounter (Signed)
Spoke to pharmacy and they stated that insurance only covers a 90 day supply for 365 day period. Cannot get until February 2024 ?

## 2021-07-23 NOTE — Telephone Encounter (Signed)
Spoke to [place] at [facility] and got patient a PA  [phone]) It will cover #60 in a 30 day period and not #180. PA good from 07/23/21 til 07/24/22 ?

## 2021-07-24 NOTE — Telephone Encounter (Signed)
[facility] called reporting that the PA for Pantoprazole has been approved  ? ?Best contact: [phone] ? ? ?

## 2021-11-21 ENCOUNTER — Other Ambulatory Visit: Payer: Self-pay | Admitting: Nurse Practitioner

## 2021-11-21 DIAGNOSIS — K219 Gastro-esophageal reflux disease without esophagitis: Secondary | ICD-10-CM

## 2021-11-21 DIAGNOSIS — I10 Essential (primary) hypertension: Secondary | ICD-10-CM

## 2021-11-21 NOTE — Telephone Encounter (Signed)
Requested by interface surescripts. Requesting too soon.  Requested Prescriptions  Refused Prescriptions Disp Refills  . pantoprazole (PROTONIX) 40 MG tablet [Pharmacy Med Name: PANTOPRAZOLE SOD DR 40 MG TAB] 180 tablet 1    Sig: TAKE 1 TABLET BY MOUTH TWICE A DAY     Gastroenterology: Proton Pump Inhibitors Passed - 11/21/2021  5:51 AM      Passed - Valid encounter within last 12 months    Recent Outpatient Visits          7 months ago History of breast cancer   [place], FNP             . hydrochlorothiazide (HYDRODIURIL) 25 MG tablet [Pharmacy Med Name: HYDROCHLOROTHIAZIDE 25 MG TAB] 90 tablet 1    Sig: TAKE 1 TABLET (25 MG TOTAL) BY MOUTH DAILY.     Cardiovascular: Diuretics - Thiazide Failed - 11/21/2021  5:51 AM      Failed - Cr in normal range and within 180 days    Creat  Date Value Ref Range Status  04/20/2021 0.93 0.50 - 1.03 mg/dL Final         Failed - K in normal range and within 180 days    Potassium  Date Value Ref Range Status  04/20/2021 3.4 (L) 3.5 - 5.3 mmol/L Final         Failed - Na in normal range and within 180 days    Sodium  Date Value Ref Range Status  04/20/2021 141 135 - 146 mmol/L Final         Failed - Valid encounter within last 6 months    Recent Outpatient Visits          7 months ago History of breast cancer   [place], FNP             Passed - Last BP in normal range    BP Readings from Last 1 Encounters:  05/09/21 115/74         . topiramate (TOPAMAX) 50 MG tablet [Pharmacy Med Name: TOPIRAMATE 50 MG TABLET] 90 tablet 1    Sig: TAKE 1 TABLET BY MOUTH EVERY DAY     Neurology: Anticonvulsants - topiramate & zonisamide Passed - 11/21/2021  5:51 AM      Passed - Cr in normal range and within 360 days    Creat  Date Value Ref Range Status  04/20/2021 0.93 0.50 - 1.03 mg/dL Final         Passed - CO2 in normal range and within 360 days    CO2   Date Value Ref Range Status  04/20/2021 27 20 - 32 mmol/L Final         Passed - ALT in normal range and within 360 days    ALT  Date Value Ref Range Status  04/20/2021 11 6 - 29 U/L Final         Passed - AST in normal range and within 360 days    AST  Date Value Ref Range Status  04/20/2021 18 10 - 35 U/L Final         Passed - Completed PHQ-2 or PHQ-9 in the last 360 days      Passed - Valid encounter within last 12 months    Recent Outpatient Visits          7 months ago History of breast cancer   [facility] [place], Almyra Free  F, FNP

## 2022-03-06 ENCOUNTER — Other Ambulatory Visit: Payer: Self-pay | Admitting: Nurse Practitioner

## 2022-03-06 DIAGNOSIS — G4700 Insomnia, unspecified: Secondary | ICD-10-CM

## 2022-03-07 NOTE — Telephone Encounter (Signed)
Requested medication (s) are due for refill today: Yes  Requested medication (s) are on the active medication list: Yes  Last refill:  06/20/21  Future visit scheduled: No  Notes to clinic:  See request.    Requested Prescriptions  Pending Prescriptions Disp Refills   zolpidem (AMBIEN) 10 MG tablet [Pharmacy Med Name: ZOLPIDEM TARTRATE 10 MG TABLET] 30 tablet     Sig: TAKE 1 TABLET BY MOUTH AT BEDTIME AS NEEDED FOR SLEEP.     Not Delegated - Psychiatry:  Anxiolytics/Hypnotics Failed - 03/06/2022  5:28 PM      Failed - This refill cannot be delegated      Failed - Urine Drug Screen completed in last 360 days      Failed - Valid encounter within last 6 months    Recent Outpatient Visits           10 months ago History of breast cancer   [facility] Bo Merino, FNP

## 2022-04-08 ENCOUNTER — Other Ambulatory Visit: Payer: Self-pay | Admitting: Nurse Practitioner

## 2022-04-08 DIAGNOSIS — K219 Gastro-esophageal reflux disease without esophagitis: Secondary | ICD-10-CM

## 2022-04-08 DIAGNOSIS — I10 Essential (primary) hypertension: Secondary | ICD-10-CM

## 2022-04-08 NOTE — Telephone Encounter (Signed)
Requested Prescriptions  Pending Prescriptions Disp Refills   pantoprazole (PROTONIX) 40 MG tablet [Pharmacy Med Name: PANTOPRAZOLE SOD DR 40 MG TAB] 180 tablet 0    Sig: TAKE 1 TABLET BY MOUTH TWICE A DAY     Gastroenterology: Proton Pump Inhibitors Passed - 04/08/2022  1:25 AM      Passed - Valid encounter within last 12 months    Recent Outpatient Visits           11 months ago History of breast cancer   [facility] Bo Merino, FNP               topiramate (TOPAMAX) 50 MG tablet [Pharmacy Med Name: TOPIRAMATE 50 MG TABLET] 90 tablet 0    Sig: TAKE 1 TABLET BY MOUTH EVERY DAY     Neurology: Anticonvulsants - topiramate & zonisamide Passed - 04/08/2022  1:25 AM      Passed - Cr in normal range and within 360 days    Creat  Date Value Ref Range Status  04/20/2021 0.93 0.50 - 1.03 mg/dL Final         Passed - CO2 in normal range and within 360 days    CO2  Date Value Ref Range Status  04/20/2021 27 20 - 32 mmol/L Final         Passed - ALT in normal range and within 360 days    ALT  Date Value Ref Range Status  04/20/2021 11 6 - 29 U/L Final         Passed - AST in normal range and within 360 days    AST  Date Value Ref Range Status  04/20/2021 18 10 - 35 U/L Final         Passed - Completed PHQ-2 or PHQ-9 in the last 360 days      Passed - Valid encounter within last 12 months    Recent Outpatient Visits           11 months ago History of breast cancer   [place], Almyra Free F, FNP               hydrochlorothiazide (HYDRODIURIL) 25 MG tablet [Pharmacy Med Name: HYDROCHLOROTHIAZIDE 25 MG TAB] 90 tablet 1    Sig: TAKE 1 TABLET (25 MG TOTAL) BY MOUTH DAILY.     Cardiovascular: Diuretics - Thiazide Failed - 04/08/2022  1:25 AM      Failed - Cr in normal range and within 180 days    Creat  Date Value Ref Range Status  04/20/2021 0.93 0.50 - 1.03 mg/dL Final         Failed - K in normal range and within  180 days    Potassium  Date Value Ref Range Status  04/20/2021 3.4 (L) 3.5 - 5.3 mmol/L Final         Failed - Na in normal range and within 180 days    Sodium  Date Value Ref Range Status  04/20/2021 141 135 - 146 mmol/L Final         Failed - Valid encounter within last 6 months    Recent Outpatient Visits           11 months ago History of breast cancer   [place], FNP              Passed - Last BP in normal range    BP Readings from Last 1  Encounters:  05/09/21 115/74

## 2022-04-08 NOTE — Telephone Encounter (Signed)
Requested Prescriptions  Pending Prescriptions Disp Refills   hydrochlorothiazide (HYDRODIURIL) 25 MG tablet [Pharmacy Med Name: HYDROCHLOROTHIAZIDE 25 MG TAB] 90 tablet 1    Sig: TAKE 1 TABLET (25 MG TOTAL) BY MOUTH DAILY.     Cardiovascular: Diuretics - Thiazide Failed - 04/08/2022  1:25 AM      Failed - Cr in normal range and within 180 days    Creat  Date Value Ref Range Status  04/20/2021 0.93 0.50 - 1.03 mg/dL Final         Failed - K in normal range and within 180 days    Potassium  Date Value Ref Range Status  04/20/2021 3.4 (L) 3.5 - 5.3 mmol/L Final         Failed - Na in normal range and within 180 days    Sodium  Date Value Ref Range Status  04/20/2021 141 135 - 146 mmol/L Final         Failed - Valid encounter within last 6 months    Recent Outpatient Visits           11 months ago History of breast cancer   [facility] Bo Merino, FNP              Passed - Last BP in normal range    BP Readings from Last 1 Encounters:  05/09/21 115/74         Signed Prescriptions Disp Refills   pantoprazole (PROTONIX) 40 MG tablet 180 tablet 0    Sig: TAKE 1 TABLET BY MOUTH TWICE A DAY     Gastroenterology: Proton Pump Inhibitors Passed - 04/08/2022  1:25 AM      Passed - Valid encounter within last 12 months    Recent Outpatient Visits           11 months ago History of breast cancer   [facility] Bo Merino, FNP               topiramate (TOPAMAX) 50 MG tablet 90 tablet 0    Sig: TAKE 1 [place]     Neurology: Anticonvulsants - topiramate & zonisamide Passed - 04/08/2022  1:25 AM      Passed - Cr in normal range and within 360 days    Creat  Date Value Ref Range Status  04/20/2021 0.93 0.50 - 1.03 mg/dL Final         Passed - CO2 in normal range and within 360 days    CO2  Date Value Ref Range Status  04/20/2021 27 20 - 32 mmol/L Final         Passed - ALT in normal range and  within 360 days    ALT  Date Value Ref Range Status  04/20/2021 11 6 - 29 U/L Final         Passed - AST in normal range and within 360 days    AST  Date Value Ref Range Status  04/20/2021 18 10 - 35 U/L Final         Passed - Completed PHQ-2 or PHQ-9 in the last 360 days      Passed - Valid encounter within last 12 months    Recent Outpatient Visits           11 months ago History of breast cancer   [facility] Bo Merino, FNP

## 2022-05-05 NOTE — Progress Notes (Unsigned)
Name: Kristin Hobbs   MRN: [id number]    DOB: 08/01/1960   Date:05/06/2022       Progress Note  Subjective  Chief Complaint  Chief Complaint  Patient presents with   Annual Exam    HPI  Patient presents for annual CPE and acute problem She is aware of possible additional charge.   Rash: pruritic rash that comes and goes for years.  She says that she scratches them.  She denies any changes in medications, soaps or detergency.  She has not put it on lately.  Recommend using cerve moisturizer after getting out of shower. Will send in steroid cream to use as needed. If no improvement will refer to dermatology.   Diet: well balanced diet Exercise: walk a lot  Sleep: 6-7 hours Last dental exam: two years ago Last eye exam: 3 years ago  [employer] Visit from 04/20/2021 in [facility]  AUDIT-C Score 0      Depression: Phq 9 is  negative    05/06/2022    8:28 AM 04/20/2021    2:25 PM  Depression screen PHQ 2/9  Decreased Interest 0 0  Down, Depressed, Hopeless 0 0  PHQ - 2 Score 0 0  Altered sleeping 0 2  Tired, decreased energy 0 0  Change in appetite 0 0  Feeling bad or failure about yourself  0 0  Trouble concentrating 0 0  Moving slowly or fidgety/restless 0 0  Suicidal thoughts 0 0  PHQ-9 Score 0 2  Difficult doing work/chores Not difficult at all Not difficult at all   Hypertension: BP Readings from Last 3 Encounters:  05/06/22 116/74  05/09/21 115/74  05/01/21 104/74   Obesity: Wt Readings from Last 3 Encounters:  05/06/22 163 lb 11.2 oz (74.3 kg)  05/09/21 160 lb (72.6 kg)  05/01/21 165 lb 6.4 oz (75 kg)   BMI Readings from Last 3 Encounters:  05/06/22 25.64 kg/m  05/09/21 25.82 kg/m  05/01/21 26.30 kg/m     Vaccines:  HPV: up to at age 63 , ask insurance if age between 75-45  Shingrix: 48-64 yo and ask insurance if covered when patient above 73 yo Pneumonia:  educated and discussed with patient. Flu:  educated and  discussed with patient.  Hep C Screening: ordered STD testing and prevention (HIV/chl/gon/syphilis): ordered Intimate partner violence:none Sexual History : not sexually active Menstrual History/LMP/Abnormal Bleeding: hysterectomy Incontinence Symptoms: she says she does have some leaking  Breast cancer:  - Last Mammogram: 2019, order [facility] to [facility] [place] - BRCA gene screening: none  Osteoporosis: Discussed high calcium and vitamin D supplementation, weight bearing exercises  Cervical cancer screening: hysterectomy  Skin cancer: Discussed monitoring for atypical lesions  Colorectal cancer: 05/09/2021   Lung cancer:   Low Dose CT Chest recommended if Age 58-80 years, 20 pack-year currently smoking OR have quit w/in 15years. Patient does not qualify.   ECG: none  Advanced Care Planning: A voluntary discussion about advance care planning including the explanation and discussion of advance directives.  Discussed health care proxy and Living will, and the patient was able to identify a health care proxy as children.  Patient does not have a living will at present time. If patient does have living will, I have requested they bring this to the clinic to be scanned in to their chart.  Lipids: Lab Results  Component Value Date   CHOL 201 (H) 04/20/2021   Lab Results  Component Value Date  HDL 67 04/20/2021   Lab Results  Component Value Date   LDLCALC 116 (H) 04/20/2021   Lab Results  Component Value Date   TRIG 81 04/20/2021   Lab Results  Component Value Date   CHOLHDL 3.0 04/20/2021   No results found for: "LDLDIRECT"  Glucose: Glucose, Bld  Date Value Ref Range Status  04/20/2021 80 65 - 99 mg/dL Final    Comment:    .            Fasting reference interval .     Patient Active Problem List   Diagnosis Date Noted   Insomnia 05/06/2022   Migraines 07/09/2021   OSA (obstructive sleep apnea) 05/01/2021   Epigastric pain 04/20/2021   Depression 04/20/2021    Anemia 04/20/2021   Cellulitis 04/20/2021   Hyperlipemia 04/20/2021   Primary osteoarthritis of both knees 11/17/2018   Cellulitis of chest wall 08/07/2014   Osteopenia 12/16/2012   GERD (gastroesophageal reflux disease) 11/15/2011   Essential hypertension 11/15/2011   Ankylosing spondylitis ([place]) 11/15/2011   Breast cancer, stage 2 ([place]) 12/24/2010    Past Surgical History:  Procedure Laterality Date   ABDOMINAL HYSTERECTOMY     vaginal hysterectomy , TRAMFLAP on abdomen   BREAST SURGERY Left    COLONOSCOPY WITH PROPOFOL N/A 05/09/2021   Procedure: COLONOSCOPY WITH PROPOFOL;  Surgeon: Jonathon Bellows, MD;  Location: [facility] ENDOSCOPY;  Service: Gastroenterology;  Laterality: N/A;    Family History  Problem Relation Age of Onset   Hypertension Mother    Hyperlipidemia Mother    Hypertension Father    Hyperlipidemia Father     Social History   Socioeconomic History   Marital status: Divorced    Spouse name: Not on file   Number of children: Not on file   Years of education: Not on file   Highest education level: Not on file  Occupational History   Not on file  Tobacco Use   Smoking status: Never   Smokeless tobacco: Never  Vaping Use   Vaping Use: Never used  Substance and Sexual Activity   Alcohol use: Never   Drug use: Never   Sexual activity: Not Currently    Partners: Female  Other Topics Concern   Not on file  Social History Narrative   Not on file   Social Determinants of Health   Financial Resource Strain: Not on file  Food Insecurity: Not on file  Transportation Needs: Not on file  Physical Activity: Not on file  Stress: Not on file  Social Connections: Not on file  Intimate Partner Violence: Not on file     Current Outpatient Medications:    Calcium Carb-Cholecalciferol 600-5 MG-MCG TABS, 1 tablet with food, Disp: , Rfl:    cyclobenzaprine (FLEXERIL) 10 MG tablet, TAKE 1 TABLET BY MOUTH THREE TIMES A DAY AS NEEDED FOR MUSCLE SPASMS., Disp: , Rfl:     hydrochlorothiazide (HYDRODIURIL) 25 MG tablet, Take 1 tablet (25 mg total) by mouth daily., Disp: 90 tablet, Rfl: 1   lovastatin (MEVACOR) 20 MG tablet, Take 1 tablet (20 mg total) by mouth daily., Disp: 90 tablet, Rfl: 3   MULTIPLE VITAMIN-FOLIC ACID PO, 1 tablet, Disp: , Rfl:    pantoprazole (PROTONIX) 40 MG tablet, TAKE 1 TABLET BY MOUTH TWICE A DAY, Disp: 180 tablet, Rfl: 0   Secukinumab (COSENTYX) 150 MG/ML SOSY, 1 ml, Disp: , Rfl:    topiramate (TOPAMAX) 50 MG tablet, TAKE 1 TABLET BY MOUTH EVERY DAY, Disp: 90 tablet, Rfl: 0  zolpidem (AMBIEN) 10 MG tablet, TAKE 1 TABLET BY MOUTH AT BEDTIME AS NEEDED FOR SLEEP., Disp: 30 tablet, Rfl: 0  Allergies  Allergen Reactions   Losartan Potassium Rash     ROS  Constitutional: Negative for fever or weight change.  Respiratory: Negative for cough and shortness of breath.   Cardiovascular: Negative for chest pain or palpitations.  Gastrointestinal: Negative for abdominal pain, no bowel changes.  Musculoskeletal: Negative for gait problem or joint swelling.  Skin: Negative for rash.  Neurological: Negative for dizziness or headache.  No other specific complaints in a complete review of systems (except as listed in HPI above).   Objective  Vitals:   05/06/22 0828  BP: 116/74  Pulse: 64  Resp: 16  Temp: 97.7 F (36.5 C)  SpO2: 98%  Weight: 163 lb 11.2 oz (74.3 kg)  Height: '5\' 7"'$  (1.702 m)    Body mass index is 25.64 kg/m.  Physical Exam Constitutional: Patient appears well-developed and well-nourished. No distress.  HENT: Head: Normocephalic and atraumatic. Ears: B TMs ok, no erythema or effusion; Nose: Nose normal. Mouth/Throat: Oropharynx is clear and moist. No oropharyngeal exudate.  Eyes: Conjunctivae and EOM are normal. Pupils are equal, round, and reactive to light. No scleral icterus.  Neck: Normal range of motion. Neck supple. No JVD present. No thyromegaly present.  Cardiovascular: Normal rate, regular rhythm and  normal heart sounds.  No murmur heard. No BLE edema. Pulmonary/Chest: Effort normal and breath sounds normal. No respiratory distress. Abdominal: Soft. Bowel sounds are normal, no distension. There is no tenderness. no masses Musculoskeletal: Normal range of motion, no joint effusions. No gross deformities Neurological: he is alert and oriented to person, place, and time. No cranial nerve deficit. Coordination, balance, strength, speech and gait are normal.  Skin: Skin is warm and dry. No rash noted. No erythema.  Psychiatric: Patient has a normal mood and affect. behavior is normal. Judgment and thought content normal.   No results found for this or any previous visit (from the past 2160 hour(s)).    Fall Risk:    05/06/2022    8:28 AM 04/20/2021    2:19 PM  Fall Risk   Falls in the past year? 0 0  Number falls in past yr: 0 0  Injury with Fall? 0 0  Risk for fall due to : No Fall Risks   Follow up Falls prevention discussed;Education provided;Falls evaluation completed Falls evaluation completed     Functional Status Survey: Is the patient deaf or have difficulty hearing?: No Does the patient have difficulty seeing, even when wearing glasses/contacts?: No Does the patient have difficulty concentrating, remembering, or making decisions?: No Does the patient have difficulty walking or climbing stairs?: No Does the patient have difficulty dressing or bathing?: No Does the patient have difficulty doing errands alone such as visiting a doctor's office or shopping?: No   Assessment & Plan  1. Annual physical exam  - CBC with Differential/Platelet - COMPLETE METABOLIC PANEL WITH GFR - Lipid panel - Hemoglobin A1c - Hepatitis C antibody - HIV Antibody (routine testing w rflx)  2. Screening for HIV without presence of risk factors  - HIV Antibody (routine testing w rflx)  3. Encounter for hepatitis C screening test for low risk patient  - Hepatitis C antibody  4. Mixed  hyperlipidemia  - Lipid panel  5. Screening for diabetes mellitus  - COMPLETE METABOLIC PANEL WITH GFR - Hemoglobin A1c  6. Screening for deficiency anemia  - CBC with  Differential/Platelet  7. Rash   Recommend using cerve moisturizer after getting out of shower. Will send in steroid cream to use as needed. If no improvement will refer to dermatology.  She is going to call back with name of pharmacy  -USPSTF grade A and B recommendations reviewed with patient; age-appropriate recommendations, preventive care, screening tests, etc discussed and encouraged; healthy living encouraged; see AVS for patient education given to patient -Discussed importance of 150 minutes of physical activity weekly, eat two servings of fish weekly, eat one serving of tree nuts ( cashews, pistachios, pecans, almonds.Marland Kitchen) every other day, eat 6 servings of fruit/vegetables daily and drink plenty of water and avoid sweet beverages.   -Reviewed Health Maintenance: due for labs, pap, mammogram

## 2022-05-06 ENCOUNTER — Ambulatory Visit (INDEPENDENT_AMBULATORY_CARE_PROVIDER_SITE_OTHER): Payer: PRIVATE HEALTH INSURANCE | Admitting: Nurse Practitioner

## 2022-05-06 ENCOUNTER — Encounter: Payer: Self-pay | Admitting: Nurse Practitioner

## 2022-05-06 VITALS — BP 116/74 | HR 64 | Temp 97.7°F | Resp 16 | Ht 67.0 in | Wt 163.7 lb

## 2022-05-06 DIAGNOSIS — Z0000 Encounter for general adult medical examination without abnormal findings: Secondary | ICD-10-CM

## 2022-05-06 DIAGNOSIS — E782 Mixed hyperlipidemia: Secondary | ICD-10-CM | POA: Diagnosis not present

## 2022-05-06 DIAGNOSIS — Z1159 Encounter for screening for other viral diseases: Secondary | ICD-10-CM

## 2022-05-06 DIAGNOSIS — Z114 Encounter for screening for human immunodeficiency virus [HIV]: Secondary | ICD-10-CM | POA: Diagnosis not present

## 2022-05-06 DIAGNOSIS — Z131 Encounter for screening for diabetes mellitus: Secondary | ICD-10-CM

## 2022-05-06 DIAGNOSIS — R21 Rash and other nonspecific skin eruption: Secondary | ICD-10-CM

## 2022-05-06 DIAGNOSIS — G4700 Insomnia, unspecified: Secondary | ICD-10-CM | POA: Insufficient documentation

## 2022-05-06 DIAGNOSIS — Z130 Encounter for screening for diseases of the blood and blood-forming organs and certain disorders involving the immune mechanism: Secondary | ICD-10-CM

## 2022-05-07 LAB — LIPID PANEL
Cholesterol: 209 mg/dL — ABNORMAL HIGH (ref <200)
HDL: 73 mg/dL (ref >=50)
LDL Cholesterol (Calc): 119 mg/dL (calc) — ABNORMAL HIGH
Non-HDL Cholesterol (Calc): 136 mg/dL (calc) — ABNORMAL HIGH (ref <130)
Total CHOL/HDL Ratio: 2.9 (calc) (ref <5.0)
Triglycerides: 80 mg/dL (ref <150)

## 2022-05-07 LAB — HEMOGLOBIN A1C
Hgb A1c MFr Bld: 5.9 % of total Hgb — ABNORMAL HIGH (ref <5.7)
Mean Plasma Glucose: 123 mg/dL
eAG (mmol/L): 6.8 mmol/L

## 2022-05-07 LAB — CBC WITH DIFFERENTIAL/PLATELET
Absolute Monocytes: 324 cells/uL (ref 200–950)
Basophils Absolute: 62 cells/uL (ref 0–200)
Basophils Relative: 1.5 %
Eosinophils Absolute: 332 cells/uL (ref 15–500)
Eosinophils Relative: 8.1 %
HCT: 42.4 % (ref 35.0–45.0)
Hemoglobin: 14.6 g/dL (ref 11.7–15.5)
Lymphs Abs: 1230 cells/uL (ref 850–3900)
MCH: 30.4 pg (ref 27.0–33.0)
MCHC: 34.4 g/dL (ref 32.0–36.0)
MCV: 88.3 fL (ref 80.0–100.0)
MPV: 12.2 fL (ref 7.5–12.5)
Monocytes Relative: 7.9 %
Neutro Abs: 2153 cells/uL (ref 1500–7800)
Neutrophils Relative %: 52.5 %
Platelets: 212 10*3/uL (ref 140–400)
RBC: 4.8 10*6/uL (ref 3.80–5.10)
RDW: 13.3 % (ref 11.0–15.0)
Total Lymphocyte: 30 %
WBC: 4.1 10*3/uL (ref 3.8–10.8)

## 2022-05-07 LAB — COMPLETE METABOLIC PANEL WITH GFR
AG Ratio: 1.3 (calc) (ref 1.0–2.5)
ALT: 14 U/L (ref 6–29)
AST: 21 U/L (ref 10–35)
Albumin: 3.9 g/dL (ref 3.6–5.1)
Alkaline phosphatase (APISO): 64 U/L (ref 37–153)
BUN: 17 mg/dL (ref 7–25)
CO2: 30 mmol/L (ref 20–32)
Calcium: 9.6 mg/dL (ref 8.6–10.4)
Chloride: 102 mmol/L (ref 98–110)
Creat: 1.02 mg/dL (ref 0.50–1.05)
Globulin: 3.1 g/dL (calc) (ref 1.9–3.7)
Glucose, Bld: 92 mg/dL (ref 65–99)
Potassium: 3.7 mmol/L (ref 3.5–5.3)
Sodium: 140 mmol/L (ref 135–146)
Total Bilirubin: 0.5 mg/dL (ref 0.2–1.2)
Total Protein: 7 g/dL (ref 6.1–8.1)
eGFR: 63 mL/min/{1.73_m2} (ref >=60)

## 2022-05-07 LAB — HIV ANTIBODY (ROUTINE TESTING W REFLEX): HIV 1&2 Ab, 4th Generation: NONREACTIVE

## 2022-05-07 LAB — HEPATITIS C ANTIBODY: Hepatitis C Ab: NONREACTIVE

## 2022-05-12 ENCOUNTER — Encounter: Payer: Self-pay | Admitting: Nurse Practitioner

## 2022-05-13 ENCOUNTER — Other Ambulatory Visit: Payer: Self-pay | Admitting: Nurse Practitioner

## 2022-05-13 ENCOUNTER — Telehealth: Payer: Self-pay | Admitting: Nurse Practitioner

## 2022-05-13 DIAGNOSIS — R21 Rash and other nonspecific skin eruption: Secondary | ICD-10-CM

## 2022-05-13 DIAGNOSIS — E782 Mixed hyperlipidemia: Secondary | ICD-10-CM

## 2022-05-13 MED ORDER — TRIAMCINOLONE ACETONIDE 0.5 % EX OINT: 1.0000 | TOPICAL_OINTMENT | Freq: Two times a day (BID) | CUTANEOUS | 0 refills | Status: DC

## 2022-05-13 NOTE — Telephone Encounter (Signed)
Called patient left message for her to call with correct pharmacy

## 2022-05-13 NOTE — Telephone Encounter (Signed)
PT has called back and states that [facility] went in and added the pharmacy, and we should see it... I do not see. Pt frustrated  [phone] States does not have time to keep checking MyChart, Pt still wants sent to [facility] and states put in [place] how to find,   Copied from [employer],  I just spoke with the pharmacist here and she changed my pharmacy to the pharmacy here at [facility]. Hopefully that works. If not, this is what she  says it shows when she typed it in: [place].     Hopefully this works.

## 2022-05-14 ENCOUNTER — Other Ambulatory Visit: Payer: Self-pay | Admitting: Emergency Medicine

## 2022-05-14 ENCOUNTER — Other Ambulatory Visit: Payer: Self-pay | Admitting: Nurse Practitioner

## 2022-05-14 DIAGNOSIS — R21 Rash and other nonspecific skin eruption: Secondary | ICD-10-CM

## 2022-05-14 DIAGNOSIS — G4700 Insomnia, unspecified: Secondary | ICD-10-CM

## 2022-05-14 MED ORDER — TRIAMCINOLONE ACETONIDE 0.5 % EX OINT: 1.0000 | TOPICAL_OINTMENT | Freq: Two times a day (BID) | CUTANEOUS | 0 refills | Status: AC

## 2022-05-14 NOTE — Telephone Encounter (Signed)
Left message for patient to call back with a pharmacy number or fax to pull up. I do not see and changes per message of pharmacy

## 2022-05-23 ENCOUNTER — Telehealth: Payer: Self-pay | Admitting: Nurse Practitioner

## 2022-05-23 ENCOUNTER — Other Ambulatory Visit: Payer: Self-pay | Admitting: Emergency Medicine

## 2022-05-23 ENCOUNTER — Other Ambulatory Visit: Payer: Self-pay

## 2022-05-23 DIAGNOSIS — E782 Mixed hyperlipidemia: Secondary | ICD-10-CM

## 2022-05-23 DIAGNOSIS — K219 Gastro-esophageal reflux disease without esophagitis: Secondary | ICD-10-CM

## 2022-05-23 DIAGNOSIS — I10 Essential (primary) hypertension: Secondary | ICD-10-CM

## 2022-05-23 DIAGNOSIS — G4700 Insomnia, unspecified: Secondary | ICD-10-CM

## 2022-05-23 MED ORDER — TOPIRAMATE 50 MG PO TABS: 50.0000 mg | ORAL_TABLET | Freq: Every day | ORAL | 3 refills | Status: DC

## 2022-05-23 MED ORDER — HYDROCHLOROTHIAZIDE 25 MG PO TABS: 25.0000 mg | ORAL_TABLET | Freq: Every day | ORAL | 3 refills | Status: DC

## 2022-05-23 MED ORDER — ZOLPIDEM TARTRATE 10 MG PO TABS: 10.0000 mg | ORAL_TABLET | Freq: Every evening | ORAL | 1 refills | Status: DC | PRN

## 2022-05-23 MED ORDER — PANTOPRAZOLE SODIUM 40 MG PO TBEC: 40.0000 mg | DELAYED_RELEASE_TABLET | Freq: Two times a day (BID) | ORAL | 3 refills | Status: DC

## 2022-05-23 MED ORDER — LOVASTATIN 20 MG PO TABS: 20.0000 mg | ORAL_TABLET | Freq: Every day | ORAL | 3 refills | Status: DC

## 2022-05-23 NOTE — Telephone Encounter (Signed)
Pt called reporting that she is upset that her prescriptions have not been sent to her pharmacy of choice. She says she discussed this during her visit weeks ago and expected for her medications to be ready by now.   Pt wants all prescriptions sent to:   [place], [place] [address]  Phone: [phone] Fax: [phone]

## 2022-05-23 NOTE — Telephone Encounter (Signed)
All scripts sent to [place] with new pharmacy to be  refilled

## 2022-06-05 ENCOUNTER — Encounter: Payer: Self-pay | Admitting: Nurse Practitioner

## 2022-06-05 ENCOUNTER — Other Ambulatory Visit: Payer: Self-pay

## 2022-06-05 DIAGNOSIS — E782 Mixed hyperlipidemia: Secondary | ICD-10-CM

## 2022-06-05 MED ORDER — LOVASTATIN 20 MG PO TABS: 20.0000 mg | ORAL_TABLET | Freq: Every day | ORAL | 3 refills | Status: DC

## 2022-06-17 LAB — HM MAMMOGRAPHY

## 2022-11-03 NOTE — Progress Notes (Deleted)
There were no vitals taken for this visit.   Subjective:    Patient ID: Kristin Hobbs, female    DOB: Aug 29, 1960, 62 y.o.   MRN: [id number]  HPI: Kristin Hobbs is a 62 y.o. female, here alone  No chief complaint on file.   History of breast cancer: 2001 breast cancer in left breast. She received cancer-treatment at [facility] left mastectomy, and chemo and radiation- lymphedema in left arm. Total hysterectomy done 2002.  Tamoxifen, Femara oral treatment through [facility] oncologist.  Done with treatment. Last mammogram was 06/17/2022.  Depression Medication none Compliant n/a Side effects n/a PHQ9 *** GAD *** Therapy ***  Previously on lexapro while she was being treated for breast cancer.       05/06/2022    8:28 AM 04/20/2021    2:25 PM  Depression screen PHQ 2/9  Decreased Interest 0 0  Down, Depressed, Hopeless 0 0  PHQ - 2 Score 0 0  Altered sleeping 0 2  Tired, decreased energy 0 0  Change in appetite 0 0  Feeling bad or failure about yourself  0 0  Trouble concentrating 0 0  Moving slowly or fidgety/restless 0 0  Suicidal thoughts 0 0  PHQ-9 Score 0 2  Difficult doing work/chores Not difficult at all Not difficult at all    Hypertension:  -Medications: hydrochlorothiazide 25 mg daily -Patient is compliant with above medications and reports no side effects. -Checking BP at home (average): *** -Highest BP at home: *** -Lowest BP at home: *** -Denies any SOB, CP, vision changes, LE edema or symptoms of hypotension -Diet: *** -Exercise: ***   HLD:  -Medications: lovastatin 20 mg daily -Patient is compliant with above medications and reports no side effects.  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  She says she does not usually take her [place] she only takes it once in awhile, if she really having trouble falling asleep. No changes  GERD GERD control status: {Blank single:19197::"controlled","uncontrolled","better","worse","exacerbated","stable"}Satisfied with current treatment? {Blank single:19197::"yes","no"} Heartburn frequency:  Medication side effects: {Blank single:19197::"yes","no"}  Medication compliance: {Blank multiple:19196::"better","worse","stable","fluctuating"} GERD medications:protonix 40mg  BID Antacid use frequency:   Duration:  Nature:  Location:  Heartburn duration:  Alleviatiating factors:   Aggravating factors:  Dysphagia: {Blank single:19197::"yes","no"} Odynophagia:  {Blank single:19197::"yes","no"} Hematemesis: {Blank single:19197::"yes","no"} Blood in stool: {Blank single:19197::"yes","no"} EGD: {Blank single:19197::"yes","no"}    Ankylosing spondylitis:  She says she sees Dr. Delton See, rheumatology once a year at [facility].  She is currently taking flexeril and cosentyx.  She says she is doing well with the treatment. No changes  Relevant past medical, surgical, family and social history reviewed and updated as indicated. Interim medical history since our last visit reviewed. Allergies and medications reviewed and updated.  Review of Systems  Constitutional: Negative for fever or weight change.  Respiratory: Negative for cough and shortness of breath.   Cardiovascular: Negative for chest pain or palpitations.  Gastrointestinal: Negative for abdominal pain, no bowel changes.  Musculoskeletal: Negative for gait problem or joint swelling.  Skin: Negative for rash.  Neurological: Negative for dizziness or headache.  No other specific complaints in a complete review of  systems (except as listed in HPI above).      Objective:    There were no vitals taken for this visit.  Wt Readings from Last 3 Encounters:  05/06/22 163 lb 11.2 oz (74.3 kg)   05/09/21 160 lb (72.6 kg)  05/01/21 165 lb 6.4 oz (75 kg)    Physical Exam  Constitutional: Patient appears well-developed and well-nourished. Obese  No distress.  HEENT: head atraumatic, normocephalic, pupils equal and reactive to light,  neck supple, throat within normal limits Cardiovascular: Normal rate, regular rhythm and normal heart sounds.  No murmur heard. No BLE edema. Pulmonary/Chest: Effort normal and breath sounds normal. No respiratory distress. Abdominal: Soft.  There is no tenderness. Psychiatric: Patient has a normal mood and affect. behavior is normal. Judgment and thought content normal.     Assessment & Plan:   Problem List Items Addressed This Visit   None    Follow up plan: No follow-ups on file.

## 2022-11-04 ENCOUNTER — Ambulatory Visit: Payer: PRIVATE HEALTH INSURANCE | Admitting: Nurse Practitioner

## 2023-02-06 ENCOUNTER — Encounter: Payer: Self-pay | Admitting: Nurse Practitioner

## 2023-02-06 ENCOUNTER — Other Ambulatory Visit: Payer: Self-pay

## 2023-02-06 ENCOUNTER — Ambulatory Visit (INDEPENDENT_AMBULATORY_CARE_PROVIDER_SITE_OTHER): Payer: PRIVATE HEALTH INSURANCE | Admitting: Nurse Practitioner

## 2023-02-06 VITALS — BP 142/84 | HR 96 | Temp 97.9°F | Resp 16 | Ht 67.0 in | Wt 155.7 lb

## 2023-02-06 DIAGNOSIS — I10 Essential (primary) hypertension: Secondary | ICD-10-CM | POA: Diagnosis not present

## 2023-02-06 DIAGNOSIS — G4700 Insomnia, unspecified: Secondary | ICD-10-CM | POA: Diagnosis not present

## 2023-02-06 MED ORDER — ZOLPIDEM TARTRATE 10 MG PO TABS: 10.0000 mg | ORAL_TABLET | Freq: Every evening | ORAL | 1 refills | Status: DC | PRN

## 2023-02-06 MED ORDER — LOSARTAN POTASSIUM 25 MG PO TABS: 25.0000 mg | ORAL_TABLET | Freq: Every day | ORAL | 0 refills | Status: DC

## 2023-02-06 NOTE — Assessment & Plan Note (Signed)
Refill of ambien sent in

## 2023-02-06 NOTE — Progress Notes (Signed)
BP (!) 142/84   Pulse 96   Temp 97.9 F (36.6 C) (Oral)   Resp 16   Ht 5\' 7"  (1.702 m)   Wt 155 lb 11.2 oz (70.6 kg)   SpO2 99%   BMI 24.39 kg/m    Subjective:    Patient ID: Kristin Hobbs, female    DOB: Sep 16, 1960, 62 y.o.   MRN: [id number]  HPI: Kristin Hobbs is a 62 y.o. female  Chief Complaint  Patient presents with   Hypertension    Allergic reaction to BP medication, need a change. Stopped medication 1 week. Had a rash   Hypertension:  -Medications: hydrochlorothiazide 25 mg daily -Patient reports she had a rash when taking hydrochlorothiazide so she stopped taking it -Checking BP at home (average): 150s/90s -Denies any SOB, CP, vision changes, LE edema or symptoms of hypotension -Diet: recommend DASH diet  -Exercise: recommend 150 min of physical activity weekly     02/06/2023    8:55 AM 05/06/2022    8:28 AM 05/09/2021   10:23 AM  Vitals with BMI  Height 5\' 7"  5\' 7"    Weight 155 lbs 11 oz 163 lbs 11 oz   BMI 24.38 25.63   Systolic 142 116 884  Diastolic 84 74 74  Pulse 96 64 78        Relevant past medical, surgical, family and social history reviewed and updated as indicated. Interim medical history since our last visit reviewed. Allergies and medications reviewed and updated.  Review of Systems  Constitutional: Negative for fever or weight change.  Respiratory: Negative for cough and shortness of breath.   Cardiovascular: Negative for chest pain or palpitations.  Gastrointestinal: Negative for abdominal pain, no bowel changes.  Musculoskeletal: Negative for gait problem or joint swelling.  Skin: Negative for rash.  Neurological: Negative for dizziness or headache.  No other specific complaints in a complete review of systems (except as listed in HPI above).      Objective:    BP (!) 142/84   Pulse 96   Temp 97.9 F (36.6 C) (Oral)   Resp 16   Ht 5\' 7"  (1.702 m)   Wt 155 lb 11.2 oz (70.6 kg)   SpO2 99%   BMI 24.39 kg/m   Wt Readings from  Last 3 Encounters:  02/06/23 155 lb 11.2 oz (70.6 kg)  05/06/22 163 lb 11.2 oz (74.3 kg)  05/09/21 160 lb (72.6 kg)    Physical Exam  Constitutional: Patient appears well-developed and well-nourished.  No distress.  HEENT: head atraumatic, normocephalic, pupils equal and reactive to light, neck supple, throat within normal limits Cardiovascular: Normal rate, regular rhythm and normal heart sounds.  No murmur heard. No BLE edema. Pulmonary/Chest: Effort normal and breath sounds normal. No respiratory distress. Abdominal: Soft.  There is no tenderness. Psychiatric: Patient has a normal mood and affect. behavior is normal. Judgment and thought content normal.  Results for orders placed or performed in visit on 05/06/22  HM MAMMOGRAPHY  Result Value Ref Range   HM Mammogram 0-4 Bi-Rad 0-4 Bi-Rad, Self Reported Normal      Assessment & Plan:   Problem List Items Addressed This Visit       Cardiovascular and Mediastinum   Essential hypertension - Primary    Losartan 25 mg sent in.  Keep blood pressure log and send message regarding readings.       Relevant Medications   losartan (COZAAR) 25 MG tablet     Other  Insomnia    Refill of ambien sent in      Relevant Medications   zolpidem (AMBIEN) 10 MG tablet     Follow up plan: Return for has appointment scheduled.

## 2023-02-06 NOTE — Assessment & Plan Note (Signed)
Losartan 25 mg sent in.  Keep blood pressure log and send message regarding readings.

## 2023-02-28 ENCOUNTER — Other Ambulatory Visit: Payer: Self-pay | Admitting: Nurse Practitioner

## 2023-02-28 DIAGNOSIS — I10 Essential (primary) hypertension: Secondary | ICD-10-CM

## 2023-03-03 ENCOUNTER — Other Ambulatory Visit: Payer: Self-pay | Admitting: Nurse Practitioner

## 2023-03-03 DIAGNOSIS — I10 Essential (primary) hypertension: Secondary | ICD-10-CM

## 2023-03-03 MED ORDER — LOSARTAN POTASSIUM 25 MG PO TABS: 25.0000 mg | ORAL_TABLET | Freq: Every day | ORAL | 1 refills | Status: DC

## 2023-03-04 NOTE — Telephone Encounter (Signed)
Reordered 03/03/23 #90 1 RF  Requested Prescriptions  Refused Prescriptions Disp Refills   losartan (COZAAR) 25 MG tablet [Pharmacy Med Name: LOSARTAN POTASSIUM 25 MG TAB] 90 tablet 1    Sig: TAKE 1 TABLET (25 MG TOTAL) BY MOUTH DAILY.     Cardiovascular:  Angiotensin Receptor Blockers Failed - 02/28/2023  1:32 PM      Failed - Cr in normal range and within 180 days    Creat  Date Value Ref Range Status  05/06/2022 1.02 0.50 - 1.05 mg/dL Final         Failed - K in normal range and within 180 days    Potassium  Date Value Ref Range Status  05/06/2022 3.7 3.5 - 5.3 mmol/L Final         Failed - Last BP in normal range    BP Readings from Last 1 Encounters:  02/06/23 (!) 142/84         Passed - Patient is not pregnant      Passed - Valid encounter within last 6 months    Recent Outpatient Visits           3 weeks ago Essential hypertension   [facility] Berniece Salines, FNP   10 months ago Annual physical exam   [facility] Berniece Salines, FNP   1 year ago History of breast cancer   [facility] Berniece Salines, FNP

## 2023-04-23 DIAGNOSIS — D225 Melanocytic nevi of trunk: Secondary | ICD-10-CM | POA: Diagnosis not present

## 2023-04-23 DIAGNOSIS — L578 Other skin changes due to chronic exposure to nonionizing radiation: Secondary | ICD-10-CM | POA: Diagnosis not present

## 2023-04-23 DIAGNOSIS — L821 Other seborrheic keratosis: Secondary | ICD-10-CM | POA: Diagnosis not present

## 2023-05-14 ENCOUNTER — Other Ambulatory Visit: Payer: Self-pay

## 2023-05-14 ENCOUNTER — Ambulatory Visit (INDEPENDENT_AMBULATORY_CARE_PROVIDER_SITE_OTHER): Payer: BC Managed Care – PPO | Admitting: Nurse Practitioner

## 2023-05-14 ENCOUNTER — Encounter: Payer: Self-pay | Admitting: Nurse Practitioner

## 2023-05-14 VITALS — BP 128/80 | HR 100 | Temp 98.5°F | Resp 16 | Ht 67.0 in | Wt 162.9 lb

## 2023-05-14 DIAGNOSIS — Z0001 Encounter for general adult medical examination with abnormal findings: Secondary | ICD-10-CM | POA: Diagnosis not present

## 2023-05-14 DIAGNOSIS — Z0000 Encounter for general adult medical examination without abnormal findings: Secondary | ICD-10-CM

## 2023-05-14 DIAGNOSIS — R7303 Prediabetes: Secondary | ICD-10-CM | POA: Diagnosis not present

## 2023-05-14 DIAGNOSIS — I10 Essential (primary) hypertension: Secondary | ICD-10-CM

## 2023-05-14 DIAGNOSIS — E782 Mixed hyperlipidemia: Secondary | ICD-10-CM

## 2023-05-14 DIAGNOSIS — G4700 Insomnia, unspecified: Secondary | ICD-10-CM

## 2023-05-14 DIAGNOSIS — K219 Gastro-esophageal reflux disease without esophagitis: Secondary | ICD-10-CM

## 2023-05-14 DIAGNOSIS — G4733 Obstructive sleep apnea (adult) (pediatric): Secondary | ICD-10-CM

## 2023-05-14 MED ORDER — LOVASTATIN 20 MG PO TABS: 20.0000 mg | ORAL_TABLET | Freq: Every day | ORAL | 3 refills | Status: AC

## 2023-05-14 MED ORDER — TOPIRAMATE 25 MG PO TABS: 25.0000 mg | ORAL_TABLET | Freq: Every day | ORAL | 1 refills | Status: DC

## 2023-05-14 MED ORDER — ZOLPIDEM TARTRATE 10 MG PO TABS: 10.0000 mg | ORAL_TABLET | Freq: Every evening | ORAL | 1 refills | Status: AC | PRN

## 2023-05-14 MED ORDER — PANTOPRAZOLE SODIUM 40 MG PO TBEC: 40.0000 mg | DELAYED_RELEASE_TABLET | Freq: Two times a day (BID) | ORAL | 3 refills | Status: AC

## 2023-05-14 MED ORDER — LOSARTAN POTASSIUM 25 MG PO TABS: 25.0000 mg | ORAL_TABLET | Freq: Every day | ORAL | 3 refills | Status: AC

## 2023-05-14 NOTE — Progress Notes (Signed)
Name: Kristin Hobbs   MRN: [id number]    DOB: 23-Sep-1960   Date:05/14/2023       Progress Note  Subjective  Chief Complaint  Chief Complaint  Patient presents with   Annual Exam    HPI  Patient presents for annual CPE and chronic conditions  OSA;  she is currently using cpap, and would like to discuss inspire.  Referral placed to ENT that specializes in inspire.  Headaches: she reports waking up with a headache, she says she thinks it is due to her sleep apnea.  She has restarted taking her topiramate. She says that works well for her.  Insomnia: patient reports that she uses ambien as needed for sleep. She says that it does help.   HTN: blood pressure at goal.  Continue taking losartan 25 mg daily  GERD; managed with pantoprazole 40 mg bid, well controlled  HLD:  getting labs today.  Currently taking lovastatin 20 mg daily. Tolerating well.   Lipid Panel     Component Value Date/Time   CHOL 209 (H) 05/06/2022 0951   TRIG 80 05/06/2022 0951   HDL 73 05/06/2022 0951   CHOLHDL 2.9 05/06/2022 0951   LDLCALC 119 (H) 05/06/2022 0951     Diet: trys to eat a well balanced diet Exercise: walking and jogging   Sleep: averages about 6 hrs a night Last dental exam: 05/13/2023 Last eye exam: several years ago  Health Maintenance  Topic Date Due   COVID-19 Vaccine (1) Never done   Zoster (Shingles) Vaccine (1 of 2) Never done   Mammogram  06/17/2023   Colon Cancer Screening  05/10/2031   DTaP/Tdap/Td vaccine (4 - Td or Tdap) 09/13/2031   Flu Shot  Completed   Hepatitis C Screening  Completed   HIV Screening  Completed   Pneumococcal Vaccination  Aged Out   HPV Vaccine  Aged [employer] Office Visit from 05/14/2023 in [facility]  AUDIT-C Score 0      Depression: Phq 9 is  negative    05/14/2023    7:43 AM 02/06/2023    8:53 AM 05/06/2022    8:28 AM 04/20/2021    2:25 PM  Depression screen PHQ 2/9  Decreased Interest 0 0 0 0  Down,  Depressed, Hopeless 0 0 0 0  PHQ - 2 Score 0 0 0 0  Altered sleeping   0 2  Tired, decreased energy   0 0  Change in appetite   0 0  Feeling bad or failure about yourself    0 0  Trouble concentrating   0 0  Moving slowly or fidgety/restless   0 0  Suicidal thoughts   0 0  PHQ-9 Score   0 2  Difficult doing work/chores   Not difficult at all Not difficult at all   Hypertension: BP Readings from Last 3 Encounters:  05/14/23 128/80  02/06/23 (!) 142/84  05/06/22 116/74   Obesity: Wt Readings from Last 3 Encounters:  05/14/23 162 lb 14.4 oz (73.9 kg)  02/06/23 155 lb 11.2 oz (70.6 kg)  05/06/22 163 lb 11.2 oz (74.3 kg)   BMI Readings from Last 3 Encounters:  05/14/23 25.51 kg/m  02/06/23 24.39 kg/m  05/06/22 25.64 kg/m     Vaccines:  HPV: up to at age 28 , ask insurance if age between 80-45  Shingrix: 41-64 yo and ask insurance if covered when patient above 2 yo Pneumonia:  educated and discussed  with patient. Flu:  educated and discussed with patient.  Hep C Screening: completed STD testing and prevention (HIV/chl/gon/syphilis): completed Intimate partner violence:none Sexual History :not currently active Menstrual History/LMP/Abnormal Bleeding: hysterectomy Incontinence Symptoms: none  Breast cancer:  - Last Mammogram: 2024, hx of breast cancer - BRCA gene screening: negative  Osteoporosis: Discussed high calcium and vitamin D supplementation, weight bearing exercises  Cervical cancer screening: hysterectomy  Skin cancer: Discussed monitoring for atypical lesions  Colorectal cancer: 05/09/2021   Lung cancer:   Low Dose CT Chest recommended if Age 54-80 years, 20 pack-year currently smoking OR have quit w/in 15years. Patient does not qualify.   ECG: none  Advanced Care Planning: A voluntary discussion about advance care planning including the explanation and discussion of advance directives.  Discussed health care proxy and Living will, and the patient was able  to identify a health care proxy as none.  Patient does not have a living will at present time. If patient does have living will, I have requested they bring this to the clinic to be scanned in to their chart.  Lipids: Lab Results  Component Value Date   CHOL 209 (H) 05/06/2022   CHOL 201 (H) 04/20/2021   Lab Results  Component Value Date   HDL 73 05/06/2022   HDL 67 04/20/2021   Lab Results  Component Value Date   LDLCALC 119 (H) 05/06/2022   LDLCALC 116 (H) 04/20/2021   Lab Results  Component Value Date   TRIG 80 05/06/2022   TRIG 81 04/20/2021   Lab Results  Component Value Date   CHOLHDL 2.9 05/06/2022   CHOLHDL 3.0 04/20/2021   No results found for: "LDLDIRECT"  Glucose: Glucose, Bld  Date Value Ref Range Status  05/06/2022 92 65 - 99 mg/dL Final    Comment:    .            Fasting reference interval .   04/20/2021 80 65 - 99 mg/dL Final    Comment:    .            Fasting reference interval .     Patient Active Problem List   Diagnosis Date Noted   Prediabetes 05/14/2023   Insomnia 05/06/2022   Migraines 07/09/2021   OSA (obstructive sleep apnea) 05/01/2021   Epigastric pain 04/20/2021   Depression 04/20/2021   Anemia 04/20/2021   Cellulitis 04/20/2021   Hyperlipemia 04/20/2021   Primary osteoarthritis of both knees 11/17/2018   Cellulitis of chest wall 08/07/2014   Osteopenia 12/16/2012   GERD (gastroesophageal reflux disease) 11/15/2011   Essential hypertension 11/15/2011   Ankylosing spondylitis (HCC) 11/15/2011   Breast cancer, stage 2 (HCC) 12/24/2010    Past Surgical History:  Procedure Laterality Date   ABDOMINAL HYSTERECTOMY     vaginal hysterectomy , TRAMFLAP on abdomen   BREAST SURGERY Left    COLONOSCOPY WITH PROPOFOL N/A 05/09/2021   Procedure: COLONOSCOPY WITH PROPOFOL;  Surgeon: Wyline Mood, MD;  Location[facility] ENDOSCOPY;  Service: Gastroenterology;  Laterality: N/A;    Family History  Problem Relation Age of Onset    Hypertension Mother    Hyperlipidemia Mother    Hypertension Father    Hyperlipidemia Father     Social History   Socioeconomic History   Marital status: Divorced    Spouse name: Not on file   Number of children: Not on file   Years of education: Not on file   Highest education level: Not on file  Occupational History  Not on file  Tobacco Use   Smoking status: Never   Smokeless tobacco: Never  Vaping Use   Vaping status: Never Used  Substance and Sexual Activity   Alcohol use: Never   Drug use: Never   Sexual activity: Not Currently    Partners: Female  Other Topics Concern   Not on file  Social History Narrative   Not on file   Social Drivers of Health   Financial Resource Strain: Low Risk  (05/14/2023)   Overall Financial Resource Strain (CARDIA)    Difficulty of Paying Living Expenses: Not hard at all  Food Insecurity: No Food Insecurity (05/14/2023)   Hunger Vital Sign    Worried About Running Out of Food in the Last Year: Never true    Ran Out of Food in the Last Year: Never true  Transportation Needs: No Transportation Needs (05/14/2023)   PRAPARE - Administrator, Civil Service (Medical): No    Lack of Transportation (Non-Medical): No  Physical Activity: Inactive (05/14/2023)   Exercise Vital Sign    Days of Exercise per Week: 0 days    Minutes of Exercise per Session: 0 min  Stress: No Stress Concern Present (05/14/2023)   [employer] of Occupational Health - Occupational Stress Questionnaire    Feeling of Stress : Only a little  Social Connections: Moderately Isolated (05/14/2023)   Social Connection and Isolation Panel [NHANES]    Frequency of Communication with Friends and Family: More than three times a week    Frequency of Social Gatherings with Friends and Family: More than three times a week    Attends Religious Services: 1 to 4 times per year    Active Member of [employer] or Organizations: No    Attends Banker Meetings:  Never    Marital Status: Divorced  Catering manager Violence: Not At Risk (05/14/2023)   Humiliation, Afraid, Rape, and Kick questionnaire    Fear of Current or Ex-Partner: No    Emotionally Abused: No    Physically Abused: No    Sexually Abused: No     Current Outpatient Medications:    amoxicillin (AMOXIL) 500 MG capsule, Take 500 mg by mouth 3 (three) times daily., Disp: , Rfl:    augmented betamethasone dipropionate (DIPROLENE-AF) 0.05 % ointment, SMARTSIG:sparingly Topical Twice Daily PRN, Disp: , Rfl:    Calcium Carb-Cholecalciferol 600-5 MG-MCG TABS, 1 tablet with food, Disp: , Rfl:    cyclobenzaprine (FLEXERIL) 10 MG tablet, TAKE 1 TABLET BY MOUTH THREE TIMES A DAY AS NEEDED FOR MUSCLE SPASMS., Disp: , Rfl:    meloxicam (MOBIC) 15 MG tablet, Take 15 mg by mouth daily., Disp: , Rfl:    MULTIPLE VITAMIN-FOLIC ACID PO, 1 tablet, Disp: , Rfl:    Secukinumab (COSENTYX) 150 MG/ML SOSY, 1 ml, Disp: , Rfl:    triamcinolone ointment (KENALOG) 0.5 %, Apply 1 Application topically 2 (two) times daily., Disp: 30 g, Rfl: 0   losartan (COZAAR) 25 MG tablet, Take 1 tablet (25 mg total) by mouth daily., Disp: 90 tablet, Rfl: 3   lovastatin (MEVACOR) 20 MG tablet, Take 1 tablet (20 mg total) by mouth daily., Disp: 90 tablet, Rfl: 3   pantoprazole (PROTONIX) 40 MG tablet, Take 1 tablet (40 mg total) by mouth 2 (two) times daily., Disp: 180 tablet, Rfl: 3   topiramate (TOPAMAX) 25 MG tablet, Take 1 tablet (25 mg total) by mouth daily., Disp: 90 tablet, Rfl: 1   zolpidem (AMBIEN) 10 MG tablet, Take  1 tablet (10 mg total) by mouth at bedtime as needed. for sleep, Disp: 30 tablet, Rfl: 1  Allergies  Allergen Reactions   Hydrochlorothiazide Rash     ROS  Constitutional: Negative for fever or weight change.  Respiratory: Negative for cough and shortness of breath.   Cardiovascular: Negative for chest pain or palpitations.  Gastrointestinal: Negative for abdominal pain, no bowel changes.   Musculoskeletal: Negative for gait problem or joint swelling.  Skin: Negative for rash.  Neurological: Negative for dizziness or headache.  No other specific complaints in a complete review of systems (except as listed in HPI above).   Objective  Vitals:   05/14/23 0746  BP: 128/80  Pulse: 100  Resp: 16  Temp: 98.5 F (36.9 C)  TempSrc: Oral  SpO2: 99%  Weight: 162 lb 14.4 oz (73.9 kg)  Height: 5\' 7"  (1.702 m)    Body mass index is 25.51 kg/m.  Physical Exam Vitals reviewed.  Constitutional:      Appearance: Normal appearance.  HENT:     Head: Normocephalic.     Right Ear: Tympanic membrane normal.     Left Ear: Tympanic membrane normal.     Nose: Nose normal.  Eyes:     Extraocular Movements: Extraocular movements intact.     Conjunctiva/sclera: Conjunctivae normal.     Pupils: Pupils are equal, round, and reactive to light.  Neck:     Thyroid: No thyroid mass, thyromegaly or thyroid tenderness.  Cardiovascular:     Rate and Rhythm: Normal rate and regular rhythm.     Pulses: Normal pulses.     Heart sounds: Normal heart sounds.  Pulmonary:     Effort: Pulmonary effort is normal.     Breath sounds: Normal breath sounds.  Abdominal:     General: Bowel sounds are normal.     Palpations: Abdomen is soft.  Musculoskeletal:        General: Normal range of motion.     Cervical back: Normal range of motion and neck supple.     Right lower leg: No edema.     Left lower leg: No edema.  Skin:    General: Skin is warm and dry.     Capillary Refill: Capillary refill takes less than 2 seconds.  Neurological:     General: No focal deficit present.     Mental Status: She is alert and oriented to person, place, and time. Mental status is at baseline.  Psychiatric:        Mood and Affect: Mood normal.        Behavior: Behavior normal.        Thought Content: Thought content normal.        Judgment: Judgment normal.      No results found for this or any previous  visit (from the past 2160 hours).   Fall Risk:    05/14/2023    7:42 AM 02/06/2023    8:53 AM 05/06/2022    8:28 AM 04/20/2021    2:19 PM  Fall Risk   Falls in the past year? 0 0 0 0  Number falls in past yr: 0 0 0 0  Injury with Fall? 0 0 0 0  Risk for fall due to : No Fall Risks No Fall Risks No Fall Risks   Follow up Falls evaluation completed Falls prevention discussed Falls prevention discussed;Education provided;Falls evaluation completed Falls evaluation completed     Functional Status Survey: Is the patient deaf or have difficulty  hearing?: No Does the patient have difficulty seeing, even when wearing glasses/contacts?: No Does the patient have difficulty concentrating, remembering, or making decisions?: No Does the patient have difficulty walking or climbing stairs?: No Does the patient have difficulty dressing or bathing?: No   Assessment & Plan  Problem List Items Addressed This Visit       Cardiovascular and Mediastinum   Essential hypertension   HTN: blood pressure at goal.  Continue taking losartan 25 mg daily      Relevant Medications   lovastatin (MEVACOR) 20 MG tablet   losartan (COZAAR) 25 MG tablet   Other Relevant Orders   CBC with Differential/Platelet   COMPLETE METABOLIC PANEL WITH GFR     Respiratory   OSA (obstructive sleep apnea)    she is currently using cpap, and would like to discuss inspire.  Referral placed to ENT that specializes in inspire.      Relevant Orders   Ambulatory referral to ENT     Digestive   GERD (gastroesophageal reflux disease)   managed with pantoprazole 40 mg bid, well controlled      Relevant Medications   pantoprazole (PROTONIX) 40 MG tablet     Other   Hyperlipemia   getting labs today.  Currently taking lovastatin 20 mg daily. Tolerating well.       Relevant Medications   lovastatin (MEVACOR) 20 MG tablet   losartan (COZAAR) 25 MG tablet   Other Relevant Orders   Lipid panel   Insomnia   patient  reports that she uses ambien as needed for sleep. She says that it does help.       Relevant Medications   zolpidem (AMBIEN) 10 MG tablet   Prediabetes   Getting labs today      Relevant Orders   COMPLETE METABOLIC PANEL WITH GFR   Hemoglobin A1c   Other Visit Diagnoses       Annual physical exam    -  Primary   Relevant Orders   CBC with Differential/Platelet   COMPLETE METABOLIC PANEL WITH GFR   Lipid panel   Hemoglobin A1c        -USPSTF grade A and B recommendations reviewed with patient; age-appropriate recommendations, preventive care, screening tests, etc discussed and encouraged; healthy living encouraged; see AVS for patient education given to patient -Discussed importance of 150 minutes of physical activity weekly, eat two servings of fish weekly, eat one serving of tree nuts ( cashews, pistachios, pecans, almonds.Marland Kitchen) every other day, eat 6 servings of fruit/vegetables daily and drink plenty of water and avoid sweet beverages.   -Reviewed Health Maintenance: yes

## 2023-05-14 NOTE — Assessment & Plan Note (Signed)
getting labs today.  Currently taking lovastatin 20 mg daily. Tolerating well.

## 2023-05-14 NOTE — Assessment & Plan Note (Signed)
HTN: blood pressure at goal.  Continue taking losartan 25 mg daily

## 2023-05-14 NOTE — Assessment & Plan Note (Signed)
patient reports that she uses ambien as needed for sleep. She says that it does help.

## 2023-05-14 NOTE — Assessment & Plan Note (Signed)
Getting labs today.

## 2023-05-14 NOTE — Assessment & Plan Note (Signed)
managed with pantoprazole 40 mg bid, well controlled

## 2023-05-14 NOTE — Assessment & Plan Note (Signed)
she is currently using cpap, and would like to discuss inspire.  Referral placed to ENT that specializes in inspire.

## 2023-05-15 ENCOUNTER — Encounter: Payer: Self-pay | Admitting: Nurse Practitioner

## 2023-05-15 LAB — COMPLETE METABOLIC PANEL WITH GFR
AG Ratio: 1.4 (calc) (ref 1.0–2.5)
ALT: 15 U/L (ref 6–29)
AST: 21 U/L (ref 10–35)
Albumin: 4.2 g/dL (ref 3.6–5.1)
Alkaline phosphatase (APISO): 86 U/L (ref 37–153)
BUN/Creatinine Ratio: 13 (calc) (ref 6–22)
BUN: 14 mg/dL (ref 7–25)
CO2: 26 mmol/L (ref 20–32)
Calcium: 9.6 mg/dL (ref 8.6–10.4)
Chloride: 104 mmol/L (ref 98–110)
Creat: 1.08 mg/dL — ABNORMAL HIGH (ref 0.50–1.05)
Globulin: 2.9 g/dL (ref 1.9–3.7)
Glucose, Bld: 99 mg/dL (ref 65–99)
Potassium: 4 mmol/L (ref 3.5–5.3)
Sodium: 138 mmol/L (ref 135–146)
Total Bilirubin: 0.8 mg/dL (ref 0.2–1.2)
Total Protein: 7.1 g/dL (ref 6.1–8.1)
eGFR: 58 mL/min/{1.73_m2} — ABNORMAL LOW (ref >=60)

## 2023-05-15 LAB — LIPID PANEL
Cholesterol: 217 mg/dL — ABNORMAL HIGH (ref <200)
HDL: 86 mg/dL (ref >=50)
LDL Cholesterol (Calc): 116 mg/dL — ABNORMAL HIGH
Non-HDL Cholesterol (Calc): 131 mg/dL — ABNORMAL HIGH (ref <130)
Total CHOL/HDL Ratio: 2.5 (calc) (ref <5.0)
Triglycerides: 63 mg/dL (ref <150)

## 2023-05-15 LAB — CBC WITH DIFFERENTIAL/PLATELET
Absolute Lymphocytes: 938 {cells}/uL (ref 850–3900)
Absolute Monocytes: 503 {cells}/uL (ref 200–950)
Basophils Absolute: 61 {cells}/uL (ref 0–200)
Basophils Relative: 0.9 %
Eosinophils Absolute: 109 {cells}/uL (ref 15–500)
Eosinophils Relative: 1.6 %
HCT: 44.6 % (ref 35.0–45.0)
Hemoglobin: 14.9 g/dL (ref 11.7–15.5)
MCH: 29.3 pg (ref 27.0–33.0)
MCHC: 33.4 g/dL (ref 32.0–36.0)
MCV: 87.8 fL (ref 80.0–100.0)
MPV: 12.3 fL (ref 7.5–12.5)
Monocytes Relative: 7.4 %
Neutro Abs: 5188 {cells}/uL (ref 1500–7800)
Neutrophils Relative %: 76.3 %
Platelets: 231 10*3/uL (ref 140–400)
RBC: 5.08 10*6/uL (ref 3.80–5.10)
RDW: 13.9 % (ref 11.0–15.0)
Total Lymphocyte: 13.8 %
WBC: 6.8 10*3/uL (ref 3.8–10.8)

## 2023-05-15 LAB — HEMOGLOBIN A1C
Hgb A1c MFr Bld: 5.7 %{Hb} — ABNORMAL HIGH (ref <5.7)
Mean Plasma Glucose: 117 mg/dL
eAG (mmol/L): 6.5 mmol/L

## 2023-12-03 DIAGNOSIS — M459 Ankylosing spondylitis of unspecified sites in spine: Secondary | ICD-10-CM | POA: Diagnosis not present

## 2024-01-05 ENCOUNTER — Encounter: Payer: Self-pay | Admitting: Nurse Practitioner

## 2024-01-05 MED ORDER — TOPIRAMATE 25 MG PO TABS: 25.0000 mg | ORAL_TABLET | Freq: Every day | ORAL | 1 refills | Status: AC

## 2024-05-02 NOTE — ED Provider Notes (Signed)
 [facility] Emergency Department Provider Note   ED Clinical Impression   Final diagnoses:  Fall, initial encounter (Primary)  Closed displaced fracture of surgical neck of left humerus, unspecified fracture morphology, initial encounter    Impression, Medical Decision Making, ED Course   Impression: 64 y.o. female with PMH most significant for GERD, hyperlipidemia, depression, hypertension, and breast CA s/p mastectomy (2001) who presents for evaluation of her left upper extremity injury post mechanical ground-level fall today.  Upon physical examination, the patient appears well and nontoxic.  Her triage vital signs are notable for an elevated BP of 159/100, otherwise unremarkable.  GCS is 15.  Her pupils are +3, equal, round, and reactive to light.  Her lungs are clear and equal bilaterally with a regular cardiac rate and rhythm.  She has +2 pitting edema extending from her left wrist to her left shoulder, although this is her baseline due to history of lymphedema from breast CA with lymph node removal.  She has notable reproducible pain at the left shoulder and left humeral head.  She is unable to perform range of motion due to pain exacerbation with movement.  +2 radial pulse intact.  She appears neurologically intact without complaints of numbness or tingling of the affected extremity.  Plan to obtain xray imaging of left shoulder and left humerus.   1235 preliminary read of left shoulder revealed minimally medial displaced, moderately angulated comminuted fracture of the surgical neck of the left humerus with extension through the greater tuberosity with associated soft tissue swelling.  Humeral head remains in anatomical alignment.  1240 orthopedics paged.   1250 Spoke with Dr. Quinton from orthopedics who recommends sling application with outpatient follow-up.  Shared decision making care plan was utilized with patient at bedside.  Patient was provided sling, per orthopedic  recommendations with ambulatory referral for follow-up within the next 2 weeks.  Patient was advised to expect a call from them.  The patient was given strict return precautions to return to the emergency department with any new or worsening symptoms including severe/worsening swelling, severe/worsening pain, numbness or tingling of the affected extremity, shortness of breath, difficulty breathing, chest pain, palpitations, or any other symptoms concerning to her.  She was provided a short course of oxycodone to take for severe pain.  She was advised to alternate ibuprofen and Tylenol for mild to moderate pain.  Diagnostic workup as below.  Orders Placed This Encounter  Procedures   XR Shoulder 3 Or More Views Left   XR Humerus Left    MDM Elements  I independently visualized the radiology images.  I reviewed the patient's prior medical records (rheumatology office encounter 12/03/2023).  I discussed the case with the Geophysicist/field seismologist.    History   Chief Complaint Chief Complaint  Patient presents with   Fall    HPI  Kristin Hobbs is a 64 y.o. female with past medical history as below who presents for evaluation of her left upper extremity injury post mechanical ground-level fall today.  Patient reports that she was attempting to clear around and area on her porch for her daughter's to use the restroom, when she slipped and fell on the ice today.  She reports falling from the fifth step up onto her abdomen and sliding down the steps.  Endorses left upper extremity pain with movement.  She reports that she has limited range of motion and baseline swelling due to history of mastectomy and lymphedema.  Declines other distracting injury, head strike, or loss  of consciousness.  Denies anticoagulation or antiplatelet use.  Review of Systems  A complete review of systems was performed and is negative other than as addressed in the HPI.   Past Medical History[1]  Past Surgical  History[2]  Active Medications[3]   Allergies[4]  Family History[5]  Short Social History[6]   Physical Exam   ED Triage Vitals [05/02/24 1123]  Enc Vitals Group     BP (!) 159/100     Pulse 92     SpO2 Pulse      Resp 18     Temp 36.9 C (98.5 F)     Temp Source Oral     SpO2 100 %     Weight 83.2 kg (183 lb 8 oz)     Height      Head Circumference      Peak Flow      Pain Score      Pain Loc      Pain Education      Exclude from Growth Chart     Constitutional: Alert and oriented. Well appearing and in no distress. Eyes: Conjunctivae are normal.  Pupils are +3, equal, round, and reactive to light. ENT      Head: Normocephalic and atraumatic.      Mouth/Throat: Mucous membranes are moist.      Neck: Supple Cardiovascular: Normal rate, regular rhythm.  Respiratory: Normal respiratory effort. Breath sounds are normal. Musculoskeletal: Nontender with normal range of motion in all extremities.      Left upper extremity: +2 pitting edema extending from her left wrist to her left shoulder, although this is her baseline due to history of lymphedema from breast CA with lymph node removal.  She has notable reproducible pain at the left shoulder and left humeral head.  She is unable to perform range of motion due to pain exacerbation with movement.  +2 radial pulse intact. Neurologic: Normal speech and language. No gross focal neurologic deficits are appreciated. Skin: Skin is warm, dry and intact. No rash noted. Psychiatric: Mood and affect are normal. Speech and behavior are normal.   Radiology   XR Shoulder 3 Or More Views Left  Preliminary Result  Minimally medially displaced, laterally angulated comminuted fracture of the surgical neck of the left humerus with extension through the greater tuberosity and associated soft tissue swelling. Humeral head remains in anatomic alignment with the glenoid.    XR Humerus Left  Final Result  Comminuted impacted humeral head  fracture.      Labs   Labs Reviewed - No data to display   Pertinent labs & imaging results that were available during my care of the patient were reviewed by me and considered in my medical decision making (see chart for details).  Portions of this record have been created using Scientist, clinical (histocompatibility and immunogenetics). Dictation errors have been sought, but may not have been identified and corrected.        [1] Past Medical History: Diagnosis Date   Ankylosing spondylitis    (CMS-HCC)    Breast cancer, left breast    (CMS-HCC) 2001   Treated with mastectomy, radiation, TAC and herceptin. Primary oncology team at [facility], [facility], [place]   Cellulitis of arm, left    treated for Strep ~7 years ago at [facility] center   Cellulitis of chest wall    Depression    GERD (gastroesophageal reflux disease)    Hyperlipidemia    Hypertension    Migraines   [  2] Past Surgical History: Procedure Laterality Date   ABDOMINAL HERNIA REPAIR     AUGMENTATION MAMMAPLASTY Right    2001   BREAST BIOPSY Left    malignant   CESAREAN SECTION     x2    HYSTERECTOMY     MASTECTOMY Left 2001   MASTECTOMY W/ SENTINEL NODE BIOPSY Left    RECONSTRUCTION BREAST W/ TRAM FLAP     x2   [3] No current facility-administered medications for this encounter.   Current Outpatient Medications  Medication Sig Dispense Refill   calcium carbonate 1250 MG capsule Take 1 capsule (1,250 mg total) by mouth in the morning and 1 capsule (1,250 mg total) in the evening. Take with meals.     cyclobenzaprine (FLEXERIL) 10 MG tablet TAKE 1 TABLET BY MOUTH THREE TIMES A DAY AS NEEDED FOR MUSCLE SPASMS. 90 tablet 2   losartan  (COZAAR ) 25 MG tablet Take 1 tablet (25 mg total) by mouth daily.     lovastatin  (MEVACOR ) 20 MG tablet Take 1 tablet (20 mg total) by mouth daily with evening meal.     pantoprazole  (PROTONIX ) 40 MG tablet Take 1 tablet (40 mg total) by mouth two (2)  times a day.  3   secukinumab (COSENTYX) 150 mg/mL Syrg Inject the contents of 1 syringe (150 mg) under the skin every twenty-eight (28) days. 3 mL 4   topiramate  (TOPAMAX ) 25 MG tablet Take 1 tablet (25 mg total) by mouth daily.     zolpidem  (AMBIEN ) 10 mg tablet Take 1 tablet (10 mg total) by mouth nightly.  5  [4] Allergies Allergen Reactions   Hydrochlorothiazide    [5] Family History Problem Relation Age of Onset   Colon cancer Maternal Aunt    Kidney cancer Maternal Uncle    Diabetes Brother    Breast cancer Neg Hx    Ovarian cancer Neg Hx   [6] Social History Tobacco Use   Smoking status: Never   Smokeless tobacco: Never  Substance Use Topics   Alcohol use: No    Alcohol/week: 0.0 standard drinks of alcohol   Drug use: No   Arty Dena Garre, FNP 05/02/24 1303

## 2024-05-04 ENCOUNTER — Other Ambulatory Visit: Payer: Self-pay

## 2024-05-04 ENCOUNTER — Ambulatory Visit: Admitting: Internal Medicine

## 2024-05-04 ENCOUNTER — Encounter: Payer: Self-pay | Admitting: Internal Medicine

## 2024-05-04 VITALS — BP 124/82 | HR 97 | Temp 98.5°F | Resp 16 | Ht 67.0 in | Wt 183.0 lb

## 2024-05-04 DIAGNOSIS — I890 Lymphedema, not elsewhere classified: Secondary | ICD-10-CM

## 2024-05-04 DIAGNOSIS — S42222D 2-part displaced fracture of surgical neck of left humerus, subsequent encounter for fracture with routine healing: Secondary | ICD-10-CM

## 2024-05-04 DIAGNOSIS — E2839 Other primary ovarian failure: Secondary | ICD-10-CM

## 2024-05-04 DIAGNOSIS — Z1231 Encounter for screening mammogram for malignant neoplasm of breast: Secondary | ICD-10-CM

## 2024-05-04 DIAGNOSIS — Z13820 Encounter for screening for osteoporosis: Secondary | ICD-10-CM

## 2024-05-04 MED ORDER — CELECOXIB 100 MG PO CAPS: 100.0000 mg | ORAL_CAPSULE | Freq: Two times a day (BID) | ORAL | 1 refills | Status: AC

## 2024-05-04 MED ORDER — TRAMADOL HCL 50 MG PO TABS: 50.0000 mg | ORAL_TABLET | Freq: Two times a day (BID) | ORAL | 0 refills | Status: AC | PRN

## 2024-05-19 ENCOUNTER — Encounter: Admitting: Nurse Practitioner
